# Patient Record
Sex: Female | Born: 1970 | Race: White | Hispanic: No | Smoking: Never smoker
Health system: Southern US, Community
[De-identification: ages and names within clinical notes are randomized; demographics above are authoritative.]

## PROBLEM LIST (undated history)

## (undated) DIAGNOSIS — Z9889 Other specified postprocedural states: Secondary | ICD-10-CM

## (undated) DIAGNOSIS — R112 Nausea with vomiting, unspecified: Secondary | ICD-10-CM

## (undated) DIAGNOSIS — M719 Bursopathy, unspecified: Secondary | ICD-10-CM

## (undated) DIAGNOSIS — M67919 Unspecified disorder of synovium and tendon, unspecified shoulder: Secondary | ICD-10-CM

## (undated) DIAGNOSIS — R61 Generalized hyperhidrosis: Secondary | ICD-10-CM

## (undated) DIAGNOSIS — J019 Acute sinusitis, unspecified: Secondary | ICD-10-CM

## (undated) DIAGNOSIS — E876 Hypokalemia: Secondary | ICD-10-CM

## (undated) DIAGNOSIS — I959 Hypotension, unspecified: Secondary | ICD-10-CM

## (undated) DIAGNOSIS — R51 Headache: Secondary | ICD-10-CM

## (undated) DIAGNOSIS — R5383 Other fatigue: Secondary | ICD-10-CM

## (undated) DIAGNOSIS — D649 Anemia, unspecified: Secondary | ICD-10-CM

## (undated) DIAGNOSIS — Z87898 Personal history of other specified conditions: Secondary | ICD-10-CM

## (undated) DIAGNOSIS — R631 Polydipsia: Secondary | ICD-10-CM

## (undated) DIAGNOSIS — L259 Unspecified contact dermatitis, unspecified cause: Secondary | ICD-10-CM

## (undated) DIAGNOSIS — R5381 Other malaise: Secondary | ICD-10-CM

## (undated) HISTORY — PX: COLONOSCOPY WITH ESOPHAGOGASTRODUODENOSCOPY (EGD): SHX5779

## (undated) HISTORY — DX: Hypotension, unspecified: I95.9

## (undated) HISTORY — DX: Other malaise: R53.81

## (undated) HISTORY — DX: Bursopathy, unspecified: M71.9

## (undated) HISTORY — PX: BREAST CYST ASPIRATION: SHX578

## (undated) HISTORY — DX: Unspecified disorder of synovium and tendon, unspecified shoulder: M67.919

## (undated) HISTORY — DX: Headache: R51

## (undated) HISTORY — PX: EXTERNAL AUDITORY CANAL RECONSTRUCTION: SHX428

## (undated) HISTORY — DX: Personal history of other specified conditions: Z87.898

## (undated) HISTORY — DX: Generalized hyperhidrosis: R61

## (undated) HISTORY — DX: Hypokalemia: E87.6

## (undated) HISTORY — DX: Unspecified contact dermatitis, unspecified cause: L25.9

## (undated) HISTORY — DX: Other fatigue: R53.83

## (undated) HISTORY — DX: Polydipsia: R63.1

## (undated) HISTORY — DX: Acute sinusitis, unspecified: J01.90

---

## 2000-11-19 ENCOUNTER — Other Ambulatory Visit: Admission: RE | Admit: 2000-11-19 | Discharge: 2000-11-19 | Payer: Self-pay | Admitting: Obstetrics and Gynecology

## 2000-11-20 ENCOUNTER — Encounter: Payer: Self-pay | Admitting: Emergency Medicine

## 2000-11-20 ENCOUNTER — Emergency Department (HOSPITAL_COMMUNITY): Admission: EM | Admit: 2000-11-20 | Discharge: 2000-11-20 | Payer: Self-pay | Admitting: Emergency Medicine

## 2001-07-14 ENCOUNTER — Other Ambulatory Visit: Admission: RE | Admit: 2001-07-14 | Discharge: 2001-07-14 | Payer: Self-pay | Admitting: Obstetrics and Gynecology

## 2001-07-29 ENCOUNTER — Ambulatory Visit (HOSPITAL_COMMUNITY): Admission: RE | Admit: 2001-07-29 | Discharge: 2001-07-29 | Payer: Self-pay | Admitting: Obstetrics and Gynecology

## 2001-07-29 ENCOUNTER — Encounter: Payer: Self-pay | Admitting: Obstetrics and Gynecology

## 2001-08-02 ENCOUNTER — Encounter: Admission: RE | Admit: 2001-08-02 | Discharge: 2001-08-02 | Payer: Self-pay | Admitting: Urology

## 2001-08-02 ENCOUNTER — Encounter: Payer: Self-pay | Admitting: Urology

## 2002-07-05 ENCOUNTER — Other Ambulatory Visit: Admission: RE | Admit: 2002-07-05 | Discharge: 2002-07-05 | Payer: Self-pay | Admitting: Obstetrics and Gynecology

## 2004-04-12 ENCOUNTER — Ambulatory Visit (HOSPITAL_COMMUNITY): Admission: RE | Admit: 2004-04-12 | Discharge: 2004-04-12 | Payer: Self-pay | Admitting: Obstetrics and Gynecology

## 2005-02-20 ENCOUNTER — Other Ambulatory Visit: Admission: RE | Admit: 2005-02-20 | Discharge: 2005-02-20 | Payer: Self-pay | Admitting: Obstetrics and Gynecology

## 2005-04-14 HISTORY — PX: TUBAL LIGATION: SHX77

## 2005-08-24 ENCOUNTER — Inpatient Hospital Stay (HOSPITAL_COMMUNITY): Admission: AD | Admit: 2005-08-24 | Discharge: 2005-08-24 | Payer: Self-pay | Admitting: Obstetrics and Gynecology

## 2005-09-11 ENCOUNTER — Inpatient Hospital Stay (HOSPITAL_COMMUNITY): Admission: AD | Admit: 2005-09-11 | Discharge: 2005-09-13 | Payer: Self-pay | Admitting: Obstetrics and Gynecology

## 2005-09-11 ENCOUNTER — Encounter (INDEPENDENT_AMBULATORY_CARE_PROVIDER_SITE_OTHER): Payer: Self-pay | Admitting: Specialist

## 2005-09-12 ENCOUNTER — Encounter (INDEPENDENT_AMBULATORY_CARE_PROVIDER_SITE_OTHER): Payer: Self-pay | Admitting: Specialist

## 2006-04-14 LAB — CONVERTED CEMR LAB: Pap Smear: NORMAL

## 2006-09-29 ENCOUNTER — Other Ambulatory Visit: Admission: RE | Admit: 2006-09-29 | Discharge: 2006-09-29 | Payer: Self-pay | Admitting: General Surgery

## 2007-06-29 ENCOUNTER — Encounter: Payer: Self-pay | Admitting: Family Medicine

## 2008-11-09 ENCOUNTER — Ambulatory Visit: Payer: Self-pay | Admitting: Family Medicine

## 2008-11-09 DIAGNOSIS — M719 Bursopathy, unspecified: Secondary | ICD-10-CM

## 2008-11-09 DIAGNOSIS — M67919 Unspecified disorder of synovium and tendon, unspecified shoulder: Secondary | ICD-10-CM | POA: Insufficient documentation

## 2008-11-09 HISTORY — DX: Unspecified disorder of synovium and tendon, unspecified shoulder: M67.919

## 2008-11-24 DIAGNOSIS — Z87898 Personal history of other specified conditions: Secondary | ICD-10-CM

## 2008-11-24 DIAGNOSIS — L259 Unspecified contact dermatitis, unspecified cause: Secondary | ICD-10-CM | POA: Insufficient documentation

## 2008-11-24 HISTORY — DX: Personal history of other specified conditions: Z87.898

## 2008-11-24 HISTORY — DX: Unspecified contact dermatitis, unspecified cause: L25.9

## 2009-03-07 ENCOUNTER — Ambulatory Visit: Payer: Self-pay | Admitting: Family Medicine

## 2009-03-07 DIAGNOSIS — J019 Acute sinusitis, unspecified: Secondary | ICD-10-CM | POA: Insufficient documentation

## 2009-03-07 HISTORY — DX: Acute sinusitis, unspecified: J01.90

## 2009-05-04 ENCOUNTER — Ambulatory Visit: Payer: Self-pay | Admitting: Family Medicine

## 2009-05-04 DIAGNOSIS — R631 Polydipsia: Secondary | ICD-10-CM | POA: Insufficient documentation

## 2009-05-04 DIAGNOSIS — R61 Generalized hyperhidrosis: Secondary | ICD-10-CM | POA: Insufficient documentation

## 2009-05-04 DIAGNOSIS — R5383 Other fatigue: Secondary | ICD-10-CM

## 2009-05-04 DIAGNOSIS — R5381 Other malaise: Secondary | ICD-10-CM

## 2009-05-04 HISTORY — DX: Generalized hyperhidrosis: R61

## 2009-05-04 HISTORY — DX: Other malaise: R53.81

## 2009-05-04 HISTORY — DX: Polydipsia: R63.1

## 2009-05-04 HISTORY — DX: Other fatigue: R53.83

## 2009-05-04 LAB — CONVERTED CEMR LAB: Blood Glucose, Fingerstick: 76

## 2009-05-07 ENCOUNTER — Telehealth: Payer: Self-pay | Admitting: Family Medicine

## 2009-05-07 LAB — CONVERTED CEMR LAB
ALT: 15 units/L (ref 0–35)
AST: 20 units/L (ref 0–37)
Albumin: 3.9 g/dL (ref 3.5–5.2)
Alkaline Phosphatase: 68 units/L (ref 39–117)
BUN: 13 mg/dL (ref 6–23)
Basophils Absolute: 0 10*3/uL (ref 0.0–0.1)
Basophils Relative: 1 % (ref 0.0–3.0)
Bilirubin, Direct: 0 mg/dL (ref 0.0–0.3)
CO2: 27 meq/L (ref 19–32)
Calcium: 8.7 mg/dL (ref 8.4–10.5)
Chloride: 108 meq/L (ref 96–112)
Creatinine, Ser: 0.7 mg/dL (ref 0.4–1.2)
Eosinophils Absolute: 0 10*3/uL (ref 0.0–0.7)
Eosinophils Relative: 0.4 % (ref 0.0–5.0)
GFR calc non Af Amer: 99.82 mL/min (ref >60)
Glucose, Bld: 86 mg/dL (ref 70–99)
HCT: 33.7 % — ABNORMAL LOW (ref 36.0–46.0)
Hemoglobin: 11 g/dL — ABNORMAL LOW (ref 12.0–15.0)
Lymphocytes Relative: 36.9 % (ref 12.0–46.0)
Lymphs Abs: 1.8 10*3/uL (ref 0.7–4.0)
MCHC: 32.5 g/dL (ref 30.0–36.0)
MCV: 90.8 fL (ref 78.0–100.0)
Monocytes Absolute: 0.4 10*3/uL (ref 0.1–1.0)
Monocytes Relative: 8.8 % (ref 3.0–12.0)
Neutro Abs: 2.8 10*3/uL (ref 1.4–7.7)
Neutrophils Relative %: 52.9 % (ref 43.0–77.0)
Platelets: 227 10*3/uL (ref 150.0–400.0)
Potassium: 3.5 meq/L (ref 3.5–5.1)
RBC: 3.72 M/uL — ABNORMAL LOW (ref 3.87–5.11)
RDW: 14.8 % — ABNORMAL HIGH (ref 11.5–14.6)
Sodium: 140 meq/L (ref 135–145)
TSH: 1.05 microintl units/mL (ref 0.35–5.50)
Total Bilirubin: 0.8 mg/dL (ref 0.3–1.2)
Total Protein: 7.2 g/dL (ref 6.0–8.3)
WBC: 5 10*3/uL (ref 4.5–10.5)

## 2009-10-15 ENCOUNTER — Emergency Department (HOSPITAL_BASED_OUTPATIENT_CLINIC_OR_DEPARTMENT_OTHER): Admission: EM | Admit: 2009-10-15 | Discharge: 2009-10-15 | Payer: Self-pay | Admitting: Emergency Medicine

## 2009-10-17 ENCOUNTER — Ambulatory Visit: Payer: Self-pay | Admitting: Family Medicine

## 2009-10-17 DIAGNOSIS — I959 Hypotension, unspecified: Secondary | ICD-10-CM | POA: Insufficient documentation

## 2009-10-17 DIAGNOSIS — E876 Hypokalemia: Secondary | ICD-10-CM | POA: Insufficient documentation

## 2009-10-17 HISTORY — DX: Hypotension, unspecified: I95.9

## 2009-10-17 HISTORY — DX: Hypokalemia: E87.6

## 2009-10-17 LAB — CONVERTED CEMR LAB
BUN: 12 mg/dL (ref 6–23)
CO2: 27 meq/L (ref 19–32)
Calcium: 8.6 mg/dL (ref 8.4–10.5)
Chloride: 108 meq/L (ref 96–112)
Creatinine, Ser: 0.7 mg/dL (ref 0.4–1.2)
GFR calc non Af Amer: 99.57 mL/min (ref >60)
Glucose, Bld: 93 mg/dL (ref 70–99)
Potassium: 4 meq/L (ref 3.5–5.1)
Sodium: 143 meq/L (ref 135–145)

## 2009-10-23 ENCOUNTER — Ambulatory Visit: Payer: Self-pay | Admitting: Radiology

## 2009-10-23 ENCOUNTER — Emergency Department (HOSPITAL_BASED_OUTPATIENT_CLINIC_OR_DEPARTMENT_OTHER): Admission: EM | Admit: 2009-10-23 | Discharge: 2009-10-23 | Payer: Self-pay | Admitting: Emergency Medicine

## 2010-03-14 ENCOUNTER — Ambulatory Visit: Payer: Self-pay | Admitting: Internal Medicine

## 2010-03-14 LAB — CONVERTED CEMR LAB
BUN: 10 mg/dL (ref 6–23)
Basophils Absolute: 0 10*3/uL (ref 0.0–0.1)
Basophils Relative: 0.2 % (ref 0.0–3.0)
CO2: 28 meq/L (ref 19–32)
Calcium: 8.8 mg/dL (ref 8.4–10.5)
Chloride: 104 meq/L (ref 96–112)
Creatinine, Ser: 0.7 mg/dL (ref 0.4–1.2)
Eosinophils Absolute: 0 10*3/uL (ref 0.0–0.7)
Eosinophils Relative: 0.3 % (ref 0.0–5.0)
GFR calc non Af Amer: 99.36 mL/min (ref >60)
Glucose, Bld: 90 mg/dL (ref 70–99)
HCT: 35.7 % — ABNORMAL LOW (ref 36.0–46.0)
Hemoglobin: 11.9 g/dL — ABNORMAL LOW (ref 12.0–15.0)
Lymphocytes Relative: 15.9 % (ref 12.0–46.0)
Lymphs Abs: 1.4 10*3/uL (ref 0.7–4.0)
MCHC: 33.3 g/dL (ref 30.0–36.0)
MCV: 89.1 fL (ref 78.0–100.0)
Monocytes Absolute: 0.6 10*3/uL (ref 0.1–1.0)
Monocytes Relative: 7.3 % (ref 3.0–12.0)
Neutro Abs: 6.5 10*3/uL (ref 1.4–7.7)
Neutrophils Relative %: 76.3 % (ref 43.0–77.0)
Platelets: 275 10*3/uL (ref 150.0–400.0)
Potassium: 4 meq/L (ref 3.5–5.1)
RBC: 4.01 M/uL (ref 3.87–5.11)
RDW: 16.3 % — ABNORMAL HIGH (ref 11.5–14.6)
Sodium: 139 meq/L (ref 135–145)
WBC: 8.5 10*3/uL (ref 4.5–10.5)

## 2010-03-15 ENCOUNTER — Telehealth: Payer: Self-pay | Admitting: Internal Medicine

## 2010-03-15 ENCOUNTER — Telehealth: Payer: Self-pay

## 2010-03-15 ENCOUNTER — Encounter: Payer: Self-pay | Admitting: Internal Medicine

## 2010-05-14 NOTE — Progress Notes (Signed)
Summary: Pt req lab results/cjr  Phone Note Call from Patient Call back at [phone] cell   Caller: Patient Summary of Call: Pt called and is req lab results.  Initial call taken by: Lucy Antigua,  May 07, 2009 10:41 AM  Follow-up for Phone Call        Pt called, labs given, instructed to begin taling milti-vit with iron and return for CBC in 1 month Follow-up by: Sid Falcon LPN,  May 07, 2009 12:05 PM

## 2010-05-14 NOTE — Letter (Signed)
Summary: Out of Work  Adult nurse at [employer]  [address]   [place], [place] [address]   Phone: [phone]  Fax: [phone]    March 15, 2010   Employee:  MICHAELENE DUTAN    To Whom It May Concern:   For Medical reasons, please excuse the above named employee from work for the following dates:  Start:   03-14-10  End:   03-18-10  If you need additional information, please feel free to contact our office.         Sincerely,    Gordy Savers  MD

## 2010-05-14 NOTE — Assessment & Plan Note (Signed)
Summary: DIABETIC CONCERNS / NIGHT SWEATS // RS   Vital Signs:  Patient profile:   40 year old female Weight:      107 pounds Temp:     98.2 degrees F oral BP sitting:   110 / 70  (left arm) Cuff size:   regular  Vitals Entered By: Sid Falcon LPN (May 04, 2009 2:19 PM) CC: diabetic concerns CBG Result 76   History of Present Illness: Patient presents today with concerns that she has not felt well for a couple of months. Has had fatigue and complaining mostly of night sweats. No fever. Has noticed increased appetite and intake but no weight changes. Intermittent loose stools. Is also noticing urine frequency and increased thirst. No burning with urination. Increased irritability. Denies any consistent depressed mood. Denies any abdominal pain, headaches, cough, dyspnea, or chest pain. Takes no medications regularly.  Menses are regular.  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    Physical Exam  General:  Well-developed,well-nourished,in no acute distress;  alert,appropriate and cooperative throughout examination Eyes:  No corneal or conjunctival inflammation noted. EOMI. Perrla. Funduscopic exam benign, without hemorrhages, exudates or papilledema. Vision grossly normal. Ears:  External ear exam shows no significant lesions or deformities.  Otoscopic examination reveals clear canals, tympanic membranes are intact bilaterally without bulging, retraction, inflammation or discharge. Hearing is grossly normal bilaterally. Nose:  External nasal examination shows no deformity or inflammation. Nasal mucosa are pink and moist without lesions or exudates. Mouth:  Oral mucosa and oropharynx without lesions or exudates.  Teeth in good repair. Neck:  No deformities, masses, or tenderness noted. Lungs:  Normal respiratory effort, chest expands symmetrically. Lungs are clear to auscultation, no crackles or wheezes. Heart:  Normal rate and regular rhythm. S1 and S2 normal without gallop, murmur, click, rub or other extra sounds. Abdomen:  Bowel sounds positive,abdomen soft and non-tender without masses, organomegaly or hernias noted. Extremities:  No clubbing, cyanosis, edema, or deformity noted with normal full range of motion of all joints.   Neurologic:  alert & oriented X3, cranial nerves II-XII intact, and strength normal in all extremities.   Skin:  Intact without suspicious lesions or rashes Cervical Nodes:  No lymphadenopathy noted Axillary Nodes:  No palpable lymphadenopathy   Impression & Recommendations:  Problem # 1:  NIGHT SWEATS (ICD-780.8) Assessment New ?etiology.  R/O hyperthyroidism.  Doubt infectious. Orders: TLB-TSH (Thyroid Stimulating Hormone) (84443-TSH) TLB-CBC Platelet - w/Differential (85025-CBCD) TLB-Hepatic/Liver Function Pnl (80076-HEPATIC) TLB-BMP (Basic Metabolic Panel-BMET) (80048-METABOL)  Problem # 2:  POLYDIPSIA (ICD-783.5) Assessment: New Nonfasting CBG in office is 76. Orders: TLB-BMP (Basic Metabolic Panel-BMET)  (80048-METABOL)  Problem # 3:  FATIGUE (ICD-780.79)  Complete Medication List: 1)  Hydrocodone-acetaminophen 5-500 Mg Tabs (Hydrocodone-acetaminophen) .... One to two by mouth q 4-6 hours as needed  Other Orders: Capillary Blood Glucose/CBG (78295)  Patient Instructions: 1)  Try reduce sugar intake. 2)  We will call you by Monday regarding lab work.

## 2010-05-14 NOTE — Assessment & Plan Note (Signed)
Summary: Post Urgent Care visit./dm   Vital Signs:  Patient profile:   40 year old female Weight:      113 pounds Temp:     98. degrees F oral BP sitting:   80 / 60  (left arm) BP standing:   75 / 50 Cuff size:   regular  Vitals Entered By: Kathrynn Speed CMA (October 17, 2009 10:04 AM)  Serial Vital Signs/Assessments:  Time      Position  BP       Pulse  Resp  Temp     By           Standing  75/50                          Evelena Peat MD  CC: Post Urgent care visit/ UTI / low potassium/ low BP/ low Iron Amemia/ white & red blood count abnormal /src   History of Present Illness: Patient is seen following emergency room visit July 4. She's been under a lot of stress and noticed increased dizziness on that day. She went to a local fire department had blood pressure of 75/40. CBG was 220 but she just had a Coke.  Patient went to [facility] emergency room and received IV fluids. Diagnosed with UTI treated with Macrodantin. Reportedly had low potassium and slightly low hemoglobin though we do not have values at this time. She was given oral potassium replacement. Patient improved somewhat symptomatically after IV fluids.  She still has fatigue at this point and some mild dizziness but less orthostasis. Taking no medications. Does have fairly heavy menses. Currently not using iron. Denies a recent fever. No diarrhea, nausea, or vomiting. No localized pain.  Current Medications (verified): 1)  Nitrofurantoin Macrocrystal 100 Mg Caps (Nitrofurantoin Macrocrystal) .... 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    Physical Exam  General:  Well-developed,well-nourished,in no acute distress; alert,appropriate and cooperative throughout examination Mouth:  Oral mucosa and oropharynx without lesions or exudates.  Teeth in good repair. Neck:  No deformities, masses, or tenderness noted. Lungs:  Normal respiratory effort, chest expands symmetrically. Lungs are clear to auscultation, no crackles or wheezes. Heart:  Normal rate and regular rhythm. S1 and S2 normal without gallop, murmur, click, rub or other extra sounds. Extremities:  no edema. Good capillary refill throughout Neurologic:  alert & oriented X3, cranial nerves II-XII intact, strength normal in all extremities, and gait normal.   Psych:  normally interactive, good eye contact, not depressed appearing, and slightly anxious.     Impression & Recommendations:  Problem # 1:  FATIGUE (ICD-780.79) Prob multifactorial-stress, anemia, low BP.  Problem # 2:  LOW BLOOD PRESSURE (ICD-458.9) low blood pressure but no signif orthostatic change.  Has low  BP at baseline and mild anemia and recent dehydration probably contibuted.  She will consider Gatorade 16 oz daily and reduce caffeine.  Start Fe supplement and consider repeat hgb in one month.  Problem # 3:  HYPOKALEMIA (ICD-276.8) by hx,  repeat today. Orders: Venipuncture (16109) TLB-BMP (Basic Metabolic Panel-BMET) (80048-METABOL)  Complete Medication List: 1)  Nitrofurantoin Macrocrystal 100 Mg Caps  (Nitrofurantoin macrocrystal) .... One capsule by mouth twice a day  Patient Instructions: 1)  start iron sulfate 325 mg one twice daily. 2)  consider supplementing with Gatorade 16 ounces per day 3)  Please schedule a follow-up appointment in 1 month.

## 2010-05-14 NOTE — Assessment & Plan Note (Signed)
Summary: body pains//ccm   Vital Signs:  Patient profile:   40 year old female Weight:      116 pounds Temp:     98.0 degrees F oral BP sitting:   90 / 70  (right arm) Cuff size:   regular  Vitals Entered By: Duard Brady LPN (March 14, 2010 3:37 PM) CC: c/o lightheaded , dizziness, ears plugged , weakness Is Patient Diabetic? No   CC:  c/o lightheaded , dizziness, ears plugged , and weakness.  History of Present Illness: 40 year old patient who has a history of chronic right shoulder pain.  She states this is aggravated by deep inhalation and exhalation.  She states she has had this pain for years.  She has benefited at least temporarily from cortisone injections.  She is not had one of these in some time.  She also complains of a general sense of unwellness.  She states she has had a prior history of anemia, as well as hypokalemia.  She is on no chronic medications.  He does have a history of chronic fatigue and hypotension.  Preventive Screening-Counseling & Management  Alcohol-Tobacco     Smoking Status: never  Allergies (verified): No Known Drug Allergies  Past History:  Past Medical History: Reviewed history from 11/09/2008 and no changes required. frequent headaches  Family History: Reviewed history from 11/09/2008 and no changes required. Family History of Arthritis Family History High cholesterol, parent, grandparent Family History Hypertension, parent, grandparent Family History Lung cancer Family history diabetes, grandparent  Social History: Reviewed history from 11/09/2008 and no changes required. 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  The patient denies anorexia, weight loss, weight gain, vision loss, decreased hearing, hoarseness, syncope, dyspnea on exertion, peripheral edema, prolonged cough, headaches, hemoptysis, abdominal pain,  melena, hematochezia, severe indigestion/heartburn, hematuria, incontinence, genital sores, suspicious skin lesions, transient blindness, difficulty walking, depression, unusual weight change, abnormal bleeding, enlarged lymph nodes, angioedema, and breast masses.    Physical Exam  General:  Well-developed,well-nourished,in no acute distress; alert,appropriate and cooperative throughout examination Head:  Normocephalic and atraumatic without obvious abnormalities. No apparent alopecia or balding. Eyes:  No corneal or conjunctival inflammation noted. EOMI. Perrla. Funduscopic exam benign, without hemorrhages, exudates or papilledema. Vision grossly normal. Ears:  External ear exam shows no significant lesions or deformities.  Otoscopic examination reveals clear canals, tympanic membranes are intact bilaterally without bulging, retraction, inflammation or discharge. Hearing is grossly normal bilaterally. Nose:  External nasal examination shows no deformity or inflammation. Nasal mucosa are pink and moist without lesions or exudates. Mouth:  Oral mucosa and oropharynx without lesions or exudates.  Teeth in good repair. Neck:  No deformities, masses, or tenderness noted. Lungs:  Normal respiratory effort, chest expands symmetrically. Lungs are clear to auscultation, no crackles or wheezes. Heart:  Normal rate and regular rhythm. S1 and S2 normal without gallop, murmur, click, rub or other extra sounds. Abdomen:  Bowel sounds positive,abdomen soft and non-tender without masses, organomegaly or hernias noted. Msk:  no tenderness about the right shoulder   Impression & Recommendations:  Problem # 1:  HYPOKALEMIA (ICD-276.8)  Orders: TLB-BMP (Basic Metabolic Panel-BMET) (80048-METABOL) Specimen Handling (52841)  Problem # 2:  FATIGUE (ICD-780.79)  Orders: Venipuncture (32440) TLB-CBC Platelet - w/Differential (85025-CBCD) Specimen Handling (10272)  Problem # 3:  UNSPEC DISORDERS  [place] (ICD-726.10)  Complete Medication List: 1)  Hydrocodone-acetaminophen 5-500 Mg Tabs (Hydrocodone-acetaminophen) .... One every 6 hours as needed for pain  Patient Instructions: 1)  Please schedule a follow-up appointment as needed. 2)  Take 400-600mg  of Ibuprofen (Advil, Motrin) with food every 4-6 hours as needed for relief of pain or comfort of fever. Prescriptions: HYDROCODONE-ACETAMINOPHEN 5-500 MG TABS (HYDROCODONE-ACETAMINOPHEN) one every 6 hours as needed for pain  #50 x 0   Entered and Authorized by:   Gordy Savers  MD   Signed by:   Gordy Savers  MD on 03/14/2010   Method used:   Print then Give to Patient   RxID:   [id number]    Orders Added: 1)  Est. Patient Level IV [14782] 2)  Venipuncture [95621] 3)  TLB-CBC Platelet - w/Differential [85025-CBCD] 4)  TLB-BMP (Basic Metabolic Panel-BMET) [80048-METABOL] 5)  Specimen Handling [99000]  Appended Document: body pains//ccm     Allergies: No Known Drug Allergies   Complete Medication List: 1)  Hydrocodone-acetaminophen 5-500 Mg Tabs (Hydrocodone-acetaminophen) .... One every 6 hours as needed for pain  Other Orders: Depo- Medrol 80mg  (J1040) Admin of Therapeutic Inj  intramuscular or subcutaneous (30865)   Medication Administration  Injection # 1:    Medication: Depo- Medrol 80mg     Diagnosis: UNSPEC DISORDERS [place] (ICD-726.10)    Route: IM    Site: R deltoid    Exp Date: 10/2012    Lot #: Dalbert [id number]    Mfr: Pharmacia    Patient tolerated injection without complications    Given by: Duard Brady LPN (March 14, 2010 5:12 PM)  Orders Added: 1)  Depo- Medrol 80mg  [J1040] 2)  Admin of Therapeutic Inj  intramuscular or subcutaneous [78469]

## 2010-05-14 NOTE — Progress Notes (Signed)
Summary: Pt requesting call from Dr Caryl Never  Phone Note Call from Patient Call back at Work Phone [phone]   Caller: Patient Call For: Evelena Peat MD Summary of Call: Pt requesting a personal call back from Dr Caryl Never about something confidential that was discussed at last OV Pt had alot of questions about her lab results, ofered to mail labs to pt home, this was done. Initial call taken by: Sid Falcon LPN,  May 07, 2009 4:58 PM  Follow-up for Phone Call        Called 05-07-09 around 5:05pm and left message to call back. Follow-up by: Evelena Peat MD,  May 08, 2009 8:27 AM  Additional Follow-up for Phone Call Additional follow up Details #1::        Pt is calling at 4:30 asking if Dr. Caryl Never can call her back Wednesday am. [phone] Additional Follow-up by: Lynann Beaver CMA,  May 08, 2009 4:32 PM    Additional Follow-up for Phone Call Additional follow up Details #2::    called and left message again for pt to call. Follow-up by: Evelena Peat MD,  May 09, 2009 12:18 PM  Additional Follow-up for Phone Call Additional follow up Details #3:: Details for Additional Follow-up Action Taken: Pt returned call, requesting call after 5pm tonight  [phone] Sid Falcon LPN  May 09, 2009 3:38 PM  Spoke with pt.  Marital stress and daily anxiety and depression issues.  Still has some night sweats.  Has had marital counseling.  After long discussion we have agreed to trial of sertraline 50 mg once daily and pt to f/u one month to reassess. Additional Follow-up by: Evelena Peat MD,  May 10, 2009 1:23 PM  New/Updated Medications: SERTRALINE HCL 50 MG TABS (SERTRALINE HCL) one by mouth once daily Prescriptions: SERTRALINE HCL 50 MG TABS (SERTRALINE HCL) one by mouth once daily  #30 x 3   Entered and Authorized by:   Evelena Peat MD   Signed by:   Evelena Peat MD on 05/10/2009   Method used:   Electronically to        [facility]  [place] [address] (retail)       [address]       [place], [place]  [address]       Ph: [phone] or [phone]       Fax: [phone]   RxID:   [id number]

## 2010-05-14 NOTE — Letter (Signed)
Summary: Records from [facility] 2008 - 2009  Records from [facility] 2008 - 2009   Imported By: Maryln Gottron 12/07/2008 07:55:05  _____________________________________________________________________  External Attachment:    Type:   Image     Comment:   External Document

## 2010-05-14 NOTE — Progress Notes (Signed)
Summary: Work note request, pt is vomiting & diarrhea  Phone Note Call from Patient Call back at [facility] Phone [phone]   Caller: Patient Call For: Amador Cunas Summary of Call: Pt called again, she is experiencing nausea, vomiting and diarrhea.  Questioning if it could be the pain med or cort inj for OV yesterday.  Pt took one pain med last night, another this am.  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  Insuructed pt to stop taking the Hydrocodone and use OTC tylenol or Aleve, bland diet and work on staying hydrated.  Pt denies fever or SOB.    Husband Terrilynn Postell will pick up work note Initial call taken by: Sid Falcon LPN,  March 15, 2010 10:59 AM  Follow-up for Phone Call        Note for work done.    Follow-up by: Duard Brady LPN,  March 15, 2010 11:04 AM  Additional Follow-up for Phone Call Additional follow up Details #1::        Does Dr Amador Cunas want her to try any other pain med?   Sid Falcon LPN  March 15, 2010 1:40 PM     Additional Follow-up for Phone Call Additional follow up Details #2::    try tylenol and alleve; give depomedrol more time Follow-up by: Gordy Savers  MD,  March 15, 2010 4:23 PM  Additional Follow-up for Phone Call Additional follow up Details #3:: Details for Additional Follow-up Action Taken: Pt informed Additional Follow-up by: Sid Falcon LPN,  March 15, 2010 4:44 PM  ok

## 2010-05-14 NOTE — Progress Notes (Signed)
Summary: needs note  Phone Note Call from Patient Call back at Home Phone [phone]   Caller: Patient---live call Summary of Call: Saw DR K yesterday. Need a doctor's note from yesterday. She states that she is in a lot pain and is in bed this mornig. call her when ready for pick up. Initial call taken by: Warnell Forester,  March 15, 2010 9:15 AM  Follow-up for Phone Call        attempt to cqall - ans mach - LMTCB if questions - note for work ready for pick up KIK Follow-up by: Duard Brady LPN,  March 15, 2010 10:01 AM

## 2010-05-14 NOTE — Assessment & Plan Note (Signed)
Summary: SINUSES//CCM   Vital Signs:  Patient profile:   40 year old female Temp:     98.8 degrees F oral BP sitting:   108 / 60  (left arm) Cuff size:   regular  Vitals Entered By: Sid Falcon LPN (March 07, 2009 3:01 PM) CC: Head cold X 10 days, ears plugged, cough   History of Present Illness: Acute visit. 10 day history sinus congestion, facial pain, bilateral ear pressure and now cough productive of thick green sputum. History of multiple ear infections in childhood. No recent fever. Taking plenty of fluids. No known drug allergies.  Allergies (verified): No Known Drug Allergies  Past History:  Past Medical History: Last updated: 11/09/2008 frequent headaches  Past Surgical History: Last updated: 11/24/2008 Tubal ligation 2007 Dr Delbert Phenix Tubes as a child Reconstuctive Ear Surgery  Review of Systems      See HPI  Physical Exam  General:  Well-developed,well-nourished,in no acute distress; alert,appropriate and cooperative throughout examination Ears:  intensive scarring of both eardrums. Otherwise no acute changes Nose:  erythema of nasal mucosa. No purulent drainage Mouth:  Oral mucosa and oropharynx without lesions or exudates.  Teeth in good repair. Neck:  No deformities, masses, or tenderness noted. Lungs:  Normal respiratory effort, chest expands symmetrically. Lungs are clear to auscultation, no crackles or wheezes. Heart:  Normal rate and regular rhythm. S1 and S2 normal without gallop, murmur, click, rub or other extra sounds.   Impression & Recommendations:  Problem # 1:  SINUSITIS, ACUTE (ICD-461.9)  Her updated medication list for this problem includes:    Azithromycin 250 Mg Tabs (Azithromycin) .Marland Kitchen... 2 by mouth today then 1 by mouth once daily for 4 days.  Complete Medication List: 1)  Hydrocodone-acetaminophen 5-500 Mg Tabs (Hydrocodone-acetaminophen) .... One to two by mouth q 4-6 hours as needed 2)  Azithromycin 250 Mg Tabs (Azithromycin)  .... 2 by mouth today then 1 by mouth once daily for 4 days.  Patient Instructions: 1)  Consider over-the-counter Mucinex 2 tablets twice daily and drinking of fluids. Prescriptions: AZITHROMYCIN 250 MG TABS (AZITHROMYCIN) 2 by mouth today then 1 by mouth once daily for 4 days.  #6 x 0   Entered and Authorized by:   Evelena Peat MD   Signed by:   Evelena Peat MD on 03/07/2009   Method used:   [facility]        [facility]  [place] [address]       [address]       [place], [place]  [address]       Ph: [phone] or [phone]       Fax: [phone]   RxID:   [id number]

## 2010-05-14 NOTE — Progress Notes (Signed)
Summary: Lab results request  Phone Note Call from Patient   Caller: Patient Call For: [facility] Summary of Call: VM, pt requesting lab results Initial call taken by: Sid Falcon LPN,  March 15, 2010 4:46 PM  Follow-up for Phone Call        all nl Follow-up by: Gordy Savers  MD,  March 15, 2010 4:57 PM  Additional Follow-up for Phone Call Additional follow up Details #1::        Pt informed on VM Additional Follow-up by: Sid Falcon LPN,  March 15, 2010 5:10 PM

## 2010-06-30 LAB — CSF CELL COUNT WITH DIFFERENTIAL
RBC Count, CSF: 0 /mm3
Tube #: 4
WBC, CSF: 0 /mm3 (ref 0–5)

## 2010-06-30 LAB — URINE MICROSCOPIC-ADD ON

## 2010-06-30 LAB — CBC
HCT: 31.7 % — ABNORMAL LOW (ref 36.0–46.0)
HCT: 33 % — ABNORMAL LOW (ref 36.0–46.0)
Hemoglobin: 10.4 g/dL — ABNORMAL LOW (ref 12.0–15.0)
Hemoglobin: 10.5 g/dL — ABNORMAL LOW (ref 12.0–15.0)
MCH: 28 pg (ref 26.0–34.0)
MCH: 29.3 pg (ref 26.0–34.0)
MCHC: 31.7 g/dL (ref 30.0–36.0)
MCHC: 33 g/dL (ref 30.0–36.0)
MCV: 88.5 fL (ref 78.0–100.0)
MCV: 88.9 fL (ref 78.0–100.0)
Platelets: 233 10*3/uL (ref 150–400)
Platelets: 259 10*3/uL (ref 150–400)
RBC: 3.56 MIL/uL — ABNORMAL LOW (ref 3.87–5.11)
RBC: 3.73 MIL/uL — ABNORMAL LOW (ref 3.87–5.11)
RDW: 15.4 % (ref 11.5–15.5)
RDW: 15.4 % (ref 11.5–15.5)
WBC: 3.7 10*3/uL — ABNORMAL LOW (ref 4.0–10.5)
WBC: 6.8 10*3/uL (ref 4.0–10.5)

## 2010-06-30 LAB — URINALYSIS, ROUTINE W REFLEX MICROSCOPIC
Bilirubin Urine: NEGATIVE
Bilirubin Urine: NEGATIVE
Glucose, UA: 100 mg/dL — AB
Glucose, UA: NEGATIVE mg/dL
Hgb urine dipstick: NEGATIVE
Ketones, ur: 15 mg/dL — AB
Ketones, ur: NEGATIVE mg/dL
Nitrite: NEGATIVE
Nitrite: POSITIVE — AB
Protein, ur: NEGATIVE mg/dL
Protein, ur: NEGATIVE mg/dL
Specific Gravity, Urine: 1.007 (ref 1.005–1.030)
Specific Gravity, Urine: 1.029 (ref 1.005–1.030)
Urobilinogen, UA: 0.2 mg/dL (ref 0.0–1.0)
Urobilinogen, UA: 0.2 mg/dL (ref 0.0–1.0)
pH: 6 (ref 5.0–8.0)
pH: 7 (ref 5.0–8.0)

## 2010-06-30 LAB — COMPREHENSIVE METABOLIC PANEL
ALT: 10 U/L (ref 0–35)
ALT: 11 U/L (ref 0–35)
AST: 16 U/L (ref 0–37)
AST: 18 U/L (ref 0–37)
Albumin: 3.6 g/dL (ref 3.5–5.2)
Albumin: 3.8 g/dL (ref 3.5–5.2)
Alkaline Phosphatase: 79 U/L (ref 39–117)
Alkaline Phosphatase: 85 U/L (ref 39–117)
BUN: 12 mg/dL (ref 6–23)
BUN: 12 mg/dL (ref 6–23)
CO2: 27 mEq/L (ref 19–32)
CO2: 28 mEq/L (ref 19–32)
Calcium: 8.5 mg/dL (ref 8.4–10.5)
Calcium: 8.6 mg/dL (ref 8.4–10.5)
Chloride: 106 mEq/L (ref 96–112)
Chloride: 108 mEq/L (ref 96–112)
Creatinine, Ser: 0.7 mg/dL (ref 0.4–1.2)
Creatinine, Ser: 0.7 mg/dL (ref 0.4–1.2)
GFR calc Af Amer: >60 mL/min (ref >60)
GFR calc Af Amer: >60 mL/min (ref >60)
GFR calc non Af Amer: >60 mL/min (ref >60)
GFR calc non Af Amer: >60 mL/min (ref >60)
Glucose, Bld: 83 mg/dL (ref 70–99)
Glucose, Bld: 92 mg/dL (ref 70–99)
Potassium: 3.4 mEq/L — ABNORMAL LOW (ref 3.5–5.1)
Potassium: 4.1 mEq/L (ref 3.5–5.1)
Sodium: 144 mEq/L (ref 135–145)
Sodium: 145 mEq/L (ref 135–145)
Total Bilirubin: 0.5 mg/dL (ref 0.3–1.2)
Total Bilirubin: 0.6 mg/dL (ref 0.3–1.2)
Total Protein: 7.1 g/dL (ref 6.0–8.3)
Total Protein: 7.5 g/dL (ref 6.0–8.3)

## 2010-06-30 LAB — DIFFERENTIAL
Basophils Absolute: 0 10*3/uL (ref 0.0–0.1)
Basophils Absolute: 0.2 10*3/uL — ABNORMAL HIGH (ref 0.0–0.1)
Basophils Relative: 0 % (ref 0–1)
Basophils Relative: 2 % — ABNORMAL HIGH (ref 0–1)
Eosinophils Absolute: 0 10*3/uL (ref 0.0–0.7)
Eosinophils Absolute: 0 10*3/uL (ref 0.0–0.7)
Eosinophils Relative: 0 % (ref 0–5)
Eosinophils Relative: 1 % (ref 0–5)
Lymphocytes Relative: 12 % (ref 12–46)
Lymphocytes Relative: 46 % (ref 12–46)
Lymphs Abs: 0.4 10*3/uL — ABNORMAL LOW (ref 0.7–4.0)
Lymphs Abs: 3.1 10*3/uL (ref 0.7–4.0)
Monocytes Absolute: 0.4 10*3/uL (ref 0.1–1.0)
Monocytes Absolute: 0.6 10*3/uL (ref 0.1–1.0)
Monocytes Relative: 15 % — ABNORMAL HIGH (ref 3–12)
Monocytes Relative: 6 % (ref 3–12)
Neutro Abs: 2.7 10*3/uL (ref 1.7–7.7)
Neutro Abs: 3.1 10*3/uL (ref 1.7–7.7)
Neutrophils Relative %: 46 % (ref 43–77)
Neutrophils Relative %: 72 % (ref 43–77)

## 2010-06-30 LAB — PREGNANCY, URINE
Preg Test, Ur: NEGATIVE
Preg Test, Ur: NEGATIVE

## 2010-06-30 LAB — PROTEIN, CSF: Total  Protein, CSF: 23 mg/dL (ref 15–45)

## 2010-06-30 LAB — CSF CULTURE: Culture: NO GROWTH

## 2010-06-30 LAB — CSF CULTURE W GRAM STAIN: Gram Stain: NONE SEEN

## 2010-06-30 LAB — GLUCOSE, CSF: Glucose, CSF: 58 mg/dL (ref 43–76)

## 2010-08-16 ENCOUNTER — Emergency Department (HOSPITAL_COMMUNITY): Payer: 59

## 2010-08-16 ENCOUNTER — Emergency Department (HOSPITAL_COMMUNITY)
Admission: EM | Admit: 2010-08-16 | Discharge: 2010-08-16 | Disposition: A | Discharge status: Home / Self Care | Payer: 59 | Attending: Emergency Medicine | Admitting: Emergency Medicine

## 2010-08-16 DIAGNOSIS — R209 Unspecified disturbances of skin sensation: Secondary | ICD-10-CM | POA: Insufficient documentation

## 2010-08-16 DIAGNOSIS — G5790 Unspecified mononeuropathy of unspecified lower limb: Secondary | ICD-10-CM | POA: Insufficient documentation

## 2010-08-16 LAB — POCT I-STAT, CHEM 8
BUN: 11 mg/dL (ref 6–23)
Calcium, Ion: 1.04 mmol/L — ABNORMAL LOW (ref 1.12–1.32)
Chloride: 104 mEq/L (ref 96–112)
Creatinine, Ser: 0.8 mg/dL (ref 0.4–1.2)
Glucose, Bld: 88 mg/dL (ref 70–99)
HCT: 37 % (ref 36.0–46.0)
Hemoglobin: 12.6 g/dL (ref 12.0–15.0)
Potassium: 4 mEq/L (ref 3.5–5.1)
Sodium: 138 mEq/L (ref 135–145)
TCO2: 27 mmol/L (ref 0–100)

## 2010-08-30 NOTE — Discharge Summary (Signed)
[name], [name]                 ACCOUNT NO.:  [id number]   MEDICAL RECORD NO.:  [id number]          PATIENT TYPE:  INP   LOCATION:  9119                          FACILITY:  [facility]   PHYSICIAN:  Zenaida Niece, M.D.DATE OF BIRTH:  [birth year]   DATE OF ADMISSION:  09/11/2005  DATE OF DISCHARGE:  09/13/2005                                 DISCHARGE SUMMARY   ADMISSION DIAGNOSIS:  Intrauterine pregnancy at 40 weeks.   DISCHARGE DIAGNOSIS:  Intrauterine pregnancy at 40 weeks and desires  surgical sterility.   PROCEDURES:  On May 31, she had a spontaneous vaginal delivery, and on June  1, she had a postpartum tubal ligation.   HISTORY AND PHYSICAL:  This is a 40 year old white female, gravida 2, para 0-  1-0-1 with an EGA of [redacted] weeks who presents with a complaint of regular  contractions.  Initial evaluation in triage revealed cervix to be 3-4 cm  dilated.  Prenatal care complicated by history of preterm premature rupture  of membranes for which she initially received a couple progesterone shots  during this pregnancy, but then these were discontinued.   PRENATAL LABORATORY DATA:  Blood type is O+ with negative antibody screen,  RPR nonreactive, rubella immune, hepatitis B surface antigen negative,  gonorrhea and chlamydia negative.  Triple screen was normal.  One-hour  Glucola was 131, and group B strep is negative.   PAST GYNECOLOGIC HISTORY:  Significant for cryotherapy in 1997.   PAST OBSTETRICAL HISTORY:  Significant for a vaginal delivery at 35 weeks, 5-  pound 7-ounce baby, complicated by preterm premature rupture of membranes.   HOSPITAL COURSE:  The patient was admitted in labor and continued to  progress on her own.  On Dr. Ebony Hail first exam, she was 2-3, complete, -2  and he performed amniotomy for augmentation.  She did progress in labor and  received an epidural.  On the afternoon of May31, she had a vaginal delivery  of a viable female infant with Apgars of 9 and  9, weight 7 pounds 7 ounces.  Placenta delivered spontaneously and was intact and uterus palpated normal.  She had a second-degree midline laceration which was repaired with 3-0  Vicryl and a small amount of bleeding from the anterior cervix was  controlled also with 3-0 Vicryl.  Postpartum, she had no significant  complications.  Predelivery hemoglobin 12, postdelivery 10.1.  She did wish  to undergo tubal ligation, and this was discussed with her by Dr. Ambrose Mantle.  On the afternoon of June1, she had a postpartum tubal ligation performed  with her epidural.  This was done without complications.  Postoperatively,  she had significant pain at the incision where her tubal was performed.  This prevented discharge on the morning of postpartum day #2.  However, she  was able to try p.o. Dilaudid, and with this, she was able to obtain  adequate pain control, and on the afternoon of June2, she was felt to be  stable enough for discharge home.   DISCHARGE INSTRUCTIONS:  Regular diet, pelvic rest, follow-up in 6 weeks.   MEDICATIONS:  Dilaudid 2 mg #30 one to two p.o. q.4-6h. p.r.n. pain, and she  was given our discharge pamphlet.      Zenaida Niece, M.D.  Electronically Signed     TDM/MEDQ  D:  10/02/2005  T:  10/02/2005  Job:  [id number]

## 2010-08-30 NOTE — Op Note (Signed)
NAMECHRISTABELLA, [name] NO.:  [id number]   MEDICAL RECORD NO.:  [id number]          PATIENT TYPE:  INP   LOCATION:  9119                          FACILITY:  [facility]   PHYSICIAN:  Malachi Pro. Ambrose Mantle, M.D. DATE OF BIRTH:  05-Jul-1970   DATE OF PROCEDURE:  DATE OF DISCHARGE:                                 OPERATIVE REPORT   PREOPERATIVE DIAGNOSIS:  Voluntary sterilization.   POSTOPERATIVE DIAGNOSIS:  Voluntary sterilization.   OPERATION:  Bilateral tubal ligation.   OPERATOR:  Malachi Pro. Ambrose Mantle, M.D.   ANESTHESIA:  Epidural.   The patient was brought to the operating room and the epidural anesthetic  that had been used for delivery was boosted.  It took a long time to become  effective but it did become effective, confirmed by pinching the abdominal  wall with no response.  The skin under the inferior portion of the umbilicus  was incised and this incision was carried in layers down to the fascia.  It  should be noted there appeared to be a small supraumbilical hernia.  Actually, it may have been in the umbilicus but it was above our incision.  I incised the fascia and then entered the peritoneal cavity.  I identified  the right tube and ovary.  The right tube had a normal fimbria.  The right  ovary appeared normal.  I made a window in the mesosalpinx with the Bovie.  I then placed two ties of 0-plain catgut proximally and distally on the  tube, excised the intervening portion of the tube and saved it as a  specimen.  I confirmed the lumen_ .  I did the same procedure on the left  side, taking a slightly shorter amount of tube.  I did not see the left  ovary.  The left tube appeared normal.  Hemostasis was completely adequate.  The abdominal wall was closed with a running suture of 0-Vicryl including  the fascia and peritoneum.  The subcu tissue was not sutured.  The skin was  closed with 3-0 plain catgut interrupted sutures.  Sponge and needle counts  were correct.   And, the patient was returned to recovery in satisfactory  condition.  Blood loss was less than 5 cc.      Malachi Pro. Ambrose Mantle, M.D.  Electronically Signed     TFH/MEDQ  D:  09/12/2005  T:  09/12/2005  Job:  [id number]

## 2010-08-30 NOTE — H&P (Signed)
NAMEJAIANNA, [name] NO.:  [id number]   MEDICAL RECORD NO.:  [id number]          PATIENT TYPE:  INP   LOCATION:  9119                          FACILITY:  [facility]   PHYSICIAN:  Malachi Pro. Ambrose Mantle, M.D. DATE OF BIRTH:  October 25, 1970   DATE OF ADMISSION:  09/11/2005  DATE OF DISCHARGE:                                HISTORY & PHYSICAL   A 40 year old, white, married female, para 1-1-0-2, who requests  sterilization after a full term delivery.  The patient was admitted on Sep 11, 2005, underwent augmented labor under epidural anesthesia and delivered  a 7-pound 7-ounce female vaginally with a second degree midline laceration.   PAST MEDICAL HISTORY:  1.  Revealed no known allergies.  2.  She did have cryotherapy on her cervix in 1997.  3.  She has a history of migraines.   She does not drink, smoke, or take drugs.   FAMILY HISTORY:  Paternal grandfather with high blood pressure, diabetes,  and heart disease.   PHYSICAL EXAMINATION:  VITAL SIGNS:  On admission revealed normal vital  signs.  HEART/LUNGS:  Normal.  ABDOMEN:  Soft and nontender.  PELVIC:  Postpartum the uterus is at the umbilicus.   IMPRESSION:  1.  Intrauterine pregnancy at 40 weeks, delivered.  2.  Request for sterilization.   The patient is prepared for a tubal ligation.      Malachi Pro. Ambrose Mantle, M.D.  Electronically Signed     TFH/MEDQ  D:  09/12/2005  T:  09/12/2005  Job:  [id number]

## 2010-12-23 ENCOUNTER — Encounter: Payer: Self-pay | Admitting: Family Medicine

## 2010-12-23 ENCOUNTER — Ambulatory Visit (INDEPENDENT_AMBULATORY_CARE_PROVIDER_SITE_OTHER): Payer: 59 | Admitting: Family Medicine

## 2010-12-23 VITALS — BP 100/60 | HR 80 | Temp 98.7°F | Resp 12 | Ht 66.0 in | Wt 105.0 lb

## 2010-12-23 DIAGNOSIS — K120 Recurrent oral aphthae: Secondary | ICD-10-CM

## 2010-12-23 MED ORDER — LIDOCAINE VISCOUS 2 % MT SOLN: 20.0000 mL | OROMUCOSAL | Status: AC | PRN

## 2010-12-23 MED ORDER — TRIAMCINOLONE ACETONIDE 0.1 % MT PSTE: PASTE | OROMUCOSAL | Status: DC

## 2010-12-23 NOTE — Patient Instructions (Signed)
Stop Amoxicillin at this time.

## 2010-12-23 NOTE — Progress Notes (Signed)
  Subjective:    Patient ID: [name], female    DOB: 06/01/1970, 40 y.o.   MRN: [id number]  HPI Mouth ulcers. Onset of illness 9 days ago. Initially had some vomiting.  About 8 days ago vomiting resolved but she had some diarrhea. She also developed sore throat and headache. Symptoms progressed and last Wednesday she went to urgent care. Rapid strep negative. Was given some type of steroid shot. Placed on amoxicillin and subsequently developed nausea and recurrent diarrhea. Now has ulcerations on her tongue tip. Pain with drinking and eating. No other skin rash. Denies fever at this time.   Review of Systems  Constitutional: Negative for fever.  HENT: Positive for sore throat. Negative for trouble swallowing.   Respiratory: Negative for cough and shortness of breath.   Skin: Negative for rash.       Objective:   Physical Exam  Constitutional: She appears well-developed and well-nourished. No distress.  HENT:  Right Ear: External ear normal.  Left Ear: External ear normal.       Patient has some aphthous ulcers at the time and otherwise oropharynx clear. No exudate  Neck: Neck supple.  Cardiovascular: Normal rate and regular rhythm.   Pulmonary/Chest: Effort normal and breath sounds normal. No respiratory distress. She has no wheezes. She has no rales.  Lymphadenopathy:    She has no cervical adenopathy.  Skin: No rash noted.          Assessment & Plan:  Probable recent viral syndrome. Aphthous type ulcers currently. Explained these are usually viral. Kenalog in [place]. Viscous Xylocaine as needed for symptomatic relief

## 2011-01-03 ENCOUNTER — Emergency Department (HOSPITAL_COMMUNITY): Payer: 59

## 2011-01-03 ENCOUNTER — Inpatient Hospital Stay (HOSPITAL_COMMUNITY)
Admission: EM | Admit: 2011-01-03 | Discharge: 2011-01-06 | DRG: 391 | Disposition: A | Discharge status: Home / Self Care | Payer: 59 | Attending: Family Medicine | Admitting: Family Medicine

## 2011-01-03 DIAGNOSIS — R112 Nausea with vomiting, unspecified: Secondary | ICD-10-CM | POA: Diagnosis present

## 2011-01-03 DIAGNOSIS — K7689 Other specified diseases of liver: Secondary | ICD-10-CM | POA: Diagnosis present

## 2011-01-03 DIAGNOSIS — J029 Acute pharyngitis, unspecified: Secondary | ICD-10-CM | POA: Diagnosis present

## 2011-01-03 DIAGNOSIS — I959 Hypotension, unspecified: Secondary | ICD-10-CM | POA: Diagnosis present

## 2011-01-03 DIAGNOSIS — E861 Hypovolemia: Secondary | ICD-10-CM | POA: Diagnosis present

## 2011-01-03 DIAGNOSIS — E43 Unspecified severe protein-calorie malnutrition: Secondary | ICD-10-CM | POA: Diagnosis present

## 2011-01-03 DIAGNOSIS — E876 Hypokalemia: Secondary | ICD-10-CM | POA: Diagnosis present

## 2011-01-03 DIAGNOSIS — R197 Diarrhea, unspecified: Secondary | ICD-10-CM | POA: Diagnosis present

## 2011-01-03 DIAGNOSIS — F1010 Alcohol abuse, uncomplicated: Secondary | ICD-10-CM | POA: Diagnosis present

## 2011-01-03 DIAGNOSIS — D649 Anemia, unspecified: Secondary | ICD-10-CM | POA: Diagnosis present

## 2011-01-03 DIAGNOSIS — K5289 Other specified noninfective gastroenteritis and colitis: Principal | ICD-10-CM | POA: Diagnosis present

## 2011-01-03 DIAGNOSIS — R634 Abnormal weight loss: Secondary | ICD-10-CM | POA: Diagnosis present

## 2011-01-03 LAB — URINALYSIS, ROUTINE W REFLEX MICROSCOPIC
Bilirubin Urine: NEGATIVE
Glucose, UA: NEGATIVE mg/dL
Hgb urine dipstick: NEGATIVE
Ketones, ur: >80 mg/dL — AB
Leukocytes, UA: NEGATIVE
Nitrite: NEGATIVE
Protein, ur: 100 mg/dL — AB
Specific Gravity, Urine: 1.02 (ref 1.005–1.030)
Urobilinogen, UA: 1 mg/dL (ref 0.0–1.0)
pH: 8.5 — ABNORMAL HIGH (ref 5.0–8.0)

## 2011-01-03 LAB — DIFFERENTIAL
Basophils Absolute: 0 10*3/uL (ref 0.0–0.1)
Basophils Relative: 0 % (ref 0–1)
Eosinophils Absolute: 0 10*3/uL (ref 0.0–0.7)
Eosinophils Relative: 0 % (ref 0–5)
Lymphocytes Relative: 5 % — ABNORMAL LOW (ref 12–46)
Lymphs Abs: 0.8 10*3/uL (ref 0.7–4.0)
Monocytes Absolute: 1.3 10*3/uL — ABNORMAL HIGH (ref 0.1–1.0)
Monocytes Relative: 8 % (ref 3–12)
Neutro Abs: 14.5 10*3/uL — ABNORMAL HIGH (ref 1.7–7.7)
Neutrophils Relative %: 87 % — ABNORMAL HIGH (ref 43–77)

## 2011-01-03 LAB — COMPREHENSIVE METABOLIC PANEL
ALT: 7 U/L (ref 0–35)
AST: 11 U/L (ref 0–37)
Albumin: 3.2 g/dL — ABNORMAL LOW (ref 3.5–5.2)
Alkaline Phosphatase: 64 U/L (ref 39–117)
BUN: 10 mg/dL (ref 6–23)
CO2: 25 mEq/L (ref 19–32)
Calcium: 7.8 mg/dL — ABNORMAL LOW (ref 8.4–10.5)
Chloride: 104 mEq/L (ref 96–112)
Creatinine, Ser: 0.48 mg/dL — ABNORMAL LOW (ref 0.50–1.10)
GFR calc Af Amer: >60 mL/min (ref >60)
GFR calc non Af Amer: >60 mL/min (ref >60)
Glucose, Bld: 107 mg/dL — ABNORMAL HIGH (ref 70–99)
Potassium: 3.1 mEq/L — ABNORMAL LOW (ref 3.5–5.1)
Sodium: 140 mEq/L (ref 135–145)
Total Bilirubin: 0.7 mg/dL (ref 0.3–1.2)
Total Protein: 6.7 g/dL (ref 6.0–8.3)

## 2011-01-03 LAB — GLUCOSE, CAPILLARY
Glucose-Capillary: 99 mg/dL (ref 70–99)
Glucose-Capillary: 68 mg/dL — ABNORMAL LOW (ref 70–99)

## 2011-01-03 LAB — CBC
HCT: 33.2 % — ABNORMAL LOW (ref 36.0–46.0)
Hemoglobin: 10.7 g/dL — ABNORMAL LOW (ref 12.0–15.0)
MCH: 29.6 pg (ref 26.0–34.0)
MCHC: 32.2 g/dL (ref 30.0–36.0)
MCV: 91.7 fL (ref 78.0–100.0)
Platelets: 331 10*3/uL (ref 150–400)
RBC: 3.62 MIL/uL — ABNORMAL LOW (ref 3.87–5.11)
RDW: 16.7 % — ABNORMAL HIGH (ref 11.5–15.5)
WBC: 16.6 10*3/uL — ABNORMAL HIGH (ref 4.0–10.5)

## 2011-01-03 LAB — WET PREP, GENITAL
Clue Cells Wet Prep HPF POC: NONE SEEN
Trich, Wet Prep: NONE SEEN
Yeast Wet Prep HPF POC: NONE SEEN

## 2011-01-03 LAB — LIPASE, BLOOD: Lipase: 276 U/L — ABNORMAL HIGH (ref 11–59)

## 2011-01-03 LAB — URINE MICROSCOPIC-ADD ON

## 2011-01-03 LAB — LACTIC ACID, PLASMA: Lactic Acid, Venous: 1 mmol/L (ref 0.5–2.2)

## 2011-01-03 LAB — MAGNESIUM: Magnesium: 1.9 mg/dL (ref 1.5–2.5)

## 2011-01-03 LAB — POCT PREGNANCY, URINE: Preg Test, Ur: NEGATIVE

## 2011-01-03 LAB — RAPID STREP SCREEN (MED CTR MEBANE ONLY): Streptococcus, Group A Screen (Direct): NEGATIVE

## 2011-01-03 MED ORDER — IOHEXOL 300 MG/ML  SOLN
100.0000 mL | Freq: Once | INTRAMUSCULAR | Status: AC | PRN
  Administered 2011-01-03: 100 mL via INTRAVENOUS

## 2011-01-04 LAB — HEMOGLOBIN A1C
Hgb A1c MFr Bld: 5.6 % (ref <5.7)
Mean Plasma Glucose: 114 mg/dL (ref <117)

## 2011-01-04 LAB — URINE CULTURE
Colony Count: 15000
Culture  Setup Time: 201209211115

## 2011-01-04 LAB — DIFFERENTIAL
Basophils Absolute: 0 10*3/uL (ref 0.0–0.1)
Basophils Relative: 0 % (ref 0–1)
Eosinophils Absolute: 0 10*3/uL (ref 0.0–0.7)
Eosinophils Relative: 0 % (ref 0–5)
Lymphocytes Relative: 23 % (ref 12–46)
Lymphs Abs: 1.5 10*3/uL (ref 0.7–4.0)
Monocytes Absolute: 0.6 10*3/uL (ref 0.1–1.0)
Monocytes Relative: 10 % (ref 3–12)
Neutro Abs: 4.2 10*3/uL (ref 1.7–7.7)
Neutrophils Relative %: 66 % (ref 43–77)

## 2011-01-04 LAB — GC/CHLAMYDIA PROBE AMP, GENITAL
Chlamydia, DNA Probe: NEGATIVE
GC Probe Amp, Genital: NEGATIVE

## 2011-01-04 LAB — LIPID PANEL
Cholesterol: 107 mg/dL (ref 0–200)
HDL: 38 mg/dL — ABNORMAL LOW (ref >39)
LDL Cholesterol: 55 mg/dL (ref 0–99)
Total CHOL/HDL Ratio: 2.8 RATIO
Triglycerides: 68 mg/dL (ref <150)
VLDL: 14 mg/dL (ref 0–40)

## 2011-01-04 LAB — GLUCOSE, CAPILLARY
Glucose-Capillary: 100 mg/dL — ABNORMAL HIGH (ref 70–99)
Glucose-Capillary: 106 mg/dL — ABNORMAL HIGH (ref 70–99)
Glucose-Capillary: 110 mg/dL — ABNORMAL HIGH (ref 70–99)
Glucose-Capillary: 126 mg/dL — ABNORMAL HIGH (ref 70–99)
Glucose-Capillary: 92 mg/dL (ref 70–99)

## 2011-01-04 LAB — COMPREHENSIVE METABOLIC PANEL
ALT: 6 U/L (ref 0–35)
AST: 10 U/L (ref 0–37)
Albumin: 2.2 g/dL — ABNORMAL LOW (ref 3.5–5.2)
Alkaline Phosphatase: 46 U/L (ref 39–117)
BUN: 5 mg/dL — ABNORMAL LOW (ref 6–23)
CO2: 23 mEq/L (ref 19–32)
Calcium: 7.5 mg/dL — ABNORMAL LOW (ref 8.4–10.5)
Chloride: 110 mEq/L (ref 96–112)
Creatinine, Ser: <0.47 mg/dL — ABNORMAL LOW (ref 0.50–1.10)
Glucose, Bld: 124 mg/dL — ABNORMAL HIGH (ref 70–99)
Potassium: 3.4 mEq/L — ABNORMAL LOW (ref 3.5–5.1)
Sodium: 139 mEq/L (ref 135–145)
Total Bilirubin: 0.5 mg/dL (ref 0.3–1.2)
Total Protein: 5 g/dL — ABNORMAL LOW (ref 6.0–8.3)

## 2011-01-04 LAB — CBC
HCT: 28.8 % — ABNORMAL LOW (ref 36.0–46.0)
Hemoglobin: 9.3 g/dL — ABNORMAL LOW (ref 12.0–15.0)
MCH: 29.9 pg (ref 26.0–34.0)
MCHC: 32.3 g/dL (ref 30.0–36.0)
MCV: 92.6 fL (ref 78.0–100.0)
Platelets: 226 10*3/uL (ref 150–400)
RBC: 3.11 MIL/uL — ABNORMAL LOW (ref 3.87–5.11)
RDW: 17.2 % — ABNORMAL HIGH (ref 11.5–15.5)
WBC: 6.3 10*3/uL (ref 4.0–10.5)

## 2011-01-04 LAB — HIV ANTIBODY (ROUTINE TESTING W REFLEX): HIV: NONREACTIVE

## 2011-01-04 LAB — LIPASE, BLOOD: Lipase: 46 U/L (ref 11–59)

## 2011-01-05 LAB — CBC
HCT: 27.8 % — ABNORMAL LOW (ref 36.0–46.0)
Hemoglobin: 8.9 g/dL — ABNORMAL LOW (ref 12.0–15.0)
MCH: 29.7 pg (ref 26.0–34.0)
MCHC: 32 g/dL (ref 30.0–36.0)
MCV: 92.7 fL (ref 78.0–100.0)
Platelets: 249 10*3/uL (ref 150–400)
RBC: 3 MIL/uL — ABNORMAL LOW (ref 3.87–5.11)
RDW: 17.2 % — ABNORMAL HIGH (ref 11.5–15.5)
WBC: 5.7 10*3/uL (ref 4.0–10.5)

## 2011-01-05 LAB — COMPREHENSIVE METABOLIC PANEL
ALT: 7 U/L (ref 0–35)
AST: 11 U/L (ref 0–37)
Albumin: 2.2 g/dL — ABNORMAL LOW (ref 3.5–5.2)
Alkaline Phosphatase: 44 U/L (ref 39–117)
BUN: <3 mg/dL — ABNORMAL LOW (ref 6–23)
CO2: 23 mEq/L (ref 19–32)
Calcium: 7.4 mg/dL — ABNORMAL LOW (ref 8.4–10.5)
Chloride: 110 mEq/L (ref 96–112)
Creatinine, Ser: <0.47 mg/dL — ABNORMAL LOW (ref 0.50–1.10)
Glucose, Bld: 107 mg/dL — ABNORMAL HIGH (ref 70–99)
Potassium: 4.1 mEq/L (ref 3.5–5.1)
Sodium: 138 mEq/L (ref 135–145)
Total Bilirubin: 0.3 mg/dL (ref 0.3–1.2)
Total Protein: 4.9 g/dL — ABNORMAL LOW (ref 6.0–8.3)

## 2011-01-05 LAB — GLUCOSE, CAPILLARY
Glucose-Capillary: 110 mg/dL — ABNORMAL HIGH (ref 70–99)
Glucose-Capillary: 78 mg/dL (ref 70–99)
Glucose-Capillary: 86 mg/dL (ref 70–99)
Glucose-Capillary: 95 mg/dL (ref 70–99)

## 2011-01-05 LAB — DIFFERENTIAL
Basophils Absolute: 0 10*3/uL (ref 0.0–0.1)
Basophils Relative: 0 % (ref 0–1)
Eosinophils Absolute: 0.1 10*3/uL (ref 0.0–0.7)
Eosinophils Relative: 1 % (ref 0–5)
Lymphocytes Relative: 32 % (ref 12–46)
Lymphs Abs: 1.8 10*3/uL (ref 0.7–4.0)
Monocytes Absolute: 0.7 10*3/uL (ref 0.1–1.0)
Monocytes Relative: 12 % (ref 3–12)
Neutro Abs: 3.1 10*3/uL (ref 1.7–7.7)
Neutrophils Relative %: 54 % (ref 43–77)

## 2011-01-05 LAB — HEPATITIS PANEL, ACUTE
HCV Ab: NEGATIVE
Hep A IgM: NEGATIVE
Hep B C IgM: NEGATIVE
Hepatitis B Surface Ag: NEGATIVE

## 2011-01-05 LAB — LIPASE, BLOOD: Lipase: 43 U/L (ref 11–59)

## 2011-01-06 ENCOUNTER — Inpatient Hospital Stay (HOSPITAL_COMMUNITY): Payer: 59

## 2011-01-06 LAB — GLUCOSE, CAPILLARY
Glucose-Capillary: 79 mg/dL (ref 70–99)
Glucose-Capillary: 118 mg/dL — ABNORMAL HIGH (ref 70–99)

## 2011-01-07 NOTE — Discharge Summary (Signed)
NAMESHOLANDA, [name] NO.:  [id number]  MEDICAL RECORD NO.:  [id number]  LOCATION:  1304                         FACILITY:  [facility]  PHYSICIAN:  Judith Sacks, MD    DATE OF BIRTH:  [birth year]  DATE OF ADMISSION:  01/03/2011 DATE OF DISCHARGE:  01/06/2011                              DISCHARGE SUMMARY   PRIMARY CARE PHYSICIAN:  [name], M.D.  CONDITION ON DISCHARGE:  Improved.  DISPOSITION:  Home.  DISCHARGE DIAGNOSES: 1. Nausea, vomiting, and diarrhea, favor multifactorial etiology     including possibly viral as well as stress. 2. Normocytic anemia, stable. 3. Alcohol abuse, reportedly resolved. 4. Weight loss secondary to nausea and vomiting, diarrhea as well as     slightly depression. 5. Liver cysts. 6. Severe protein-calorie malnutrition of acute illness.  HISTORY OF PRESENT ILLNESS:  This is [name] 40 year old woman who presented with nausea and vomiting and diarrhea.  She has been sick for approximately 3 weeks and had lost quite [name] bit of weight. She presented dehydrated.  HOSPITAL COURSE:  [name] was admitted to medical floor, treated with IV fluids, and further evaluated.  Imaging was unremarkable for acute etiology for her diarrhea, although did show multiple low-density lesions in the liver, which could not be fully characterized.  These were felt to be noncontributory.  She never had any diarrhea while in the hospital and so no stool studies were able to be obtained.  Her nausea and vomiting also quickly resolved.  Her serum lipase was elevated on admission, but within normal limits the following day.  It is possible that she might have had [name] couple of bouts of mild pancreatitis.  However, [name] CT imaging showed [name] normal pancreas.  Of note, she had been abusing alcohol for the last year and [name] half as she has had quite [name] bit of stress in her life and is going through divorce. However, she reports she has had very little alcohol in the  last month. She was seen by Nutrition, Resource Breeze was recommended for her severe protein-calorie malnutrition.  At this point, the patient has been able to eat and keep fluids down, feels much better, has minimal nausea and would like to go home.  CONSULTATIONS:  None.  PROCEDURES:  None.  IMAGING: 1. Chest x-ray, September 21st:  No acute disease. 2. CT of the abdomen and pelvis, September 21st:  Edematous thickening     of the gastric body could represent gastritis. 3. Cystic lesion in the left adnexa, likely represents [name] functional     ovarian cyst.  Appendix not identified, but no secondary signs of     acute appendicitis. 4. Multiple low-density lesions in the liver, cannot be fully     characterize, recommend nonemergent MRI abdomen with and without     contrast.  DISCUSSION:  The patient has been under extraordinary amount of stress, is currently going through depression and continues to work full time. Her family feels that she has not been taking care of herself and attributes [name] lot of her symptoms to stress, which she has been coping with with difficulty.  Her workup here in the hospital was  unremarkable except for liver cyst, which again felt to be noncontributory at this point.  Her symptoms are most likely related to stress as well as possible viral illness.  Also in the differential would be continued occult alcohol use, although the patient seems to be quite honest about past problems.  At this point, she seems to be much improved, she is tolerating [name] diet and vomiting has resolved.  She has had no diarrhea and I think she has reached maximum medical benefit.  However, we recommend she follow up with her primary care physician in the next week for followup.  She is agreeable to this.  We will keep her on PPI therapy at this point as she did have some gastritis, which could be secondary to her viral illness or even [name] gastritis perhaps related to alcohol.  If  her symptoms recur, could consider outpatient GI evaluation, although she does not give [name] history suggestive of peptic ulcer disease or irritable or inflammatory bowel disease.  MICROBIOLOGY: 1. Urine culture, 15,000 colonies, multiple bacterial morphotypes     present, none predominant. 2. Blood cultures x2, September 21st, no growth to date.  PERTINENT LABORATORY STUDIES: 1. [name] wet prep done in the emergency room was essentially negative. 2. Urine pregnancy was negative. 3. Initial lipase was 276, the following day was 46. 4. Hemoglobin A1c was within normal limits. 5. GC and chlamydia probes were negative.  The patient has had no     discharge.  It is unclear why these examinations were performed. 6. HIV antibody was nonreactive. 7. Acute hepatitis panel was negative.  Her hepatitis B surface     antigen and hepatitis B core antibody.  Hepatitis [name] and hepatitis C     also negative. 8. CBC notable for [name] hemoglobin of 8.9.  There is no microcytosis. 9. Basic metabolic panel is unremarkable. 10.Hepatic function panel was unremarkable.  DISCHARGE INSTRUCTIONS:  The patient will be discharged home today.  DIET:  Bland diet as desired.  ACTIVITIES:  Unrestricted.  She may return to work on October 1st.  DISCHARGE MEDICATIONS:  New, 1. Colace 100 mg p.o. b.i.d., use to prevent constipation while on     iron, stop if diarrhea occurs. 2. Iron sulfate 325 mg p.o. b.i.d.  Note, this will cause stool to     turn dark and may cause constipation. 3. Multivitamin p.o. daily. 4. Resource peach liquid p.o. t.i.d. 5. Zofran 4 mg by mouth every 6 hours as needed for nausea or     vomiting. 6. Potassium chloride 20 mEq p.o. daily for the next 2 weeks. 7. Protonix 40 mg p.o. daily for the next 2 weeks.  Discontinue aspirin, which may have contributed to her gastritis.  TIME COORDINATING DISCHARGE:  DICTATION ENDS AT THIS POINT     Judith Sacks, MD     DG/MEDQ  D:  01/06/2011   T:  01/07/2011  Job:  [id number]  cc:   [name], M.D.  Electronically Signed by [name]  on 01/07/2011 10:31:41 PM

## 2011-01-07 NOTE — H&P (Signed)
[name], STREIGHT NO.:  [id number]  MEDICAL RECORD NO.:  [id number]  LOCATION:  1304                         FACILITY:  [facility]  PHYSICIAN:  Alvino Blood, MD      DATE OF BIRTH:  03/06/1971  DATE OF ADMISSION:  01/03/2011 DATE OF DISCHARGE:                             HISTORY & PHYSICAL   CHIEF COMPLAINT:  Nausea, vomiting, and diarrhea.  HISTORY OF PRESENT ILLNESS:  [name] is a 40 year old Caucasian female who has a history of migraine headaches, who presented to the emergency room today with nausea and vomiting and diarrhea which started about 2- 1/2 to 3 weeks ago.  She has had multiple episodes of emesis, which are nonbloody, but greenish in color, stating that she is unable to keep anything down.  She also reports 3 to 4 episodes of diarrhea stools without any mucus or blood in them, associated with intermittent abdominal pain which is relieved when she lay still and draws up both legs.  She has visited her primary care provider's office as well as the Urgent Care on several occasions since the onset and about a week ago, she also developed sore throat with swollen lymph glands in her neck with difficulty swallowing.  At that point, a throat strep screen was done, which was apparently negative.  She was; however, started on amoxicillin.  However, these appear to make her nausea, vomiting, and diarrhea worse.  After 3 days, she called her primary care provider who informed her to stop the antibiotics as the strep screen was negative. The patient feels dehydrated, has general weakness, and dizziness especially when she stands up, but no syncopal episodes.  She also admits to an episode of fever with intermittent chills about a week ago and recorded a temperature of about 102 at that time but this appears to have resolved.  She denies any chest pains, palpitations, shortness of breath, cough, urinary symptoms such as dysuria or frequency.  In  the emergency room, CBC was noted to be abnormal as her white cell count was elevated at 16.6 and hemoglobin and hematocrit were 10.7 and 33.2 respectively.  Her potassium was low in the chemistry panel and her lipase was also elevated at 276.  Urinalysis was negative for infection. A CT of the abdomen and pelvis showed edematous thickening of the stomach consistent with gastritis.  There is also incidental finding of multiple low density lesions in the liver of unclear etiology with cystic lesion in the left adnexa consistent with ovarian cyst.  She was given IV antiemetics, IV fluid hydration, and then referred to the medical service for further management.  PAST MEDICAL HISTORY: 1. Migraine headaches. 2. Normal vaginal delivery. 3. Recent treatment for pharyngitis with amoxicillin.  PAST SURGICAL HISTORY: 1. Bilateral tubal ligation. 2. 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  ALLERGIES:  No known drug allergies.  MEDICATIONS: 1. Aspirin 81 mg orally daily.  FAMILY HISTORY:  Her mother has hypertension.  Grandmother had lung cancer.  She was a smoker. A grandfather had hypertension and diabetes.  She has a niece who has rhabdosarcoma.  Her father appears to be in good health.  SOCIAL HISTORY:  She is separated, has 2 children, works in a Psychiatric nurse.  Denies use of tobacco or illicit drugs.  She occasionally drinks alcohol. Her Family pfysician is Dr. Evelena Peat.  REVIEW OF SYSTEMS:  She admits to some headaches and also anorexia for the past few days and some difficulty breathing occasionally, otherwise all systems are reviewed and are negative except as mentioned in the history of present illness.  The patient also admits to 14 pounds weight loss over the past 2 weeks.  PHYSICAL EXAMINATION:  VITAL SIGNS: Temperature 99.2, pulse 72 per minute, respiratory rate 23 per minute, blood pressure 88/51, oxygen saturation  100% on room air. GENERAL EXAMINATION:  This is a young Caucasian female who appears ill looking, but not in any painful distress. HEENT: Examination of the head is normocephalic and atraumatic.  Pupils are equal and reactive to light and accommodation.  There is equal ocular movement bilaterally.  The external ears and nose appears normal. The oropharyngeal mucosa is dry with evidence of pharyngeal erythema and exudate. NECK: Supple with tender lymphadenopathy without any evidence of thyromegaly or jugular venous distention. CHEST: Clear to auscultation without any wheeze or rales.  There is good anterior respiratory effort bilaterally. CARDIOVASCULAR: Normal first and second heart sounds are heard, which are regular rate and rhythm without any murmurs, rubs or gallops. Abdomen: Soft with right upper quadrant and epigastric tenderness, but no rebound tenderness or guarding.  Bowel sounds are present and there is no palpable hepatosplenomegaly. CNS: She is alert and oriented x3 without any focal neurological deficits. EXTREMITIES: The peripheral pulses are palpable.  There is no evidence of peripheral edema, calf tenderness, or tenosynovitis.  SKIN: Warm and dry without any active rash or lesions.  LABORATORY DATA:  CBC:  White cell count is elevated at 16.6, hemoglobin is 10.7 and hematocrit 33.2, platelet count is 331, neutrophils 87%, MCV 91.7.  Metabolic panel:  Sodium 140, potassium is low at 3.1, chloride 104, CO2 of 25, BUN 107, creatinine 1, glucose 107, calcium 7.9, albumin 3.2.  Liver function tests:  Alkaline phosphatase 64, total bilirubin 0.7, AST 11, ALT 7.  Lactic acid level is 1, lipase is elevated to 2786. Urinalysis shows turbid urine with pH of 8.5, ketones greater than 80, protein 100, bacteria many, WBC 0-2, RBC 3 to 6, nitrite and leukocyte negative.  IMAGING STUDIES: 1. Chest x-ray, this does not show any evidence of acute     cardiopulmonary disease. 2. CT  of the abdomen and pelvis with contrast shows edematous     thickening of the gastric body, which could represent gastritis,     cystic lesion in the left adnexa likely represent a functional     ovarian cyst, the appendix is not identified but no secondary signs     of acute appendicitis noted, multiple low density lesions in the     liver cannot be fully characterized.  Recommend nonemergency MRI of     the abdomen with and without contrast.  ASSESSMENT: 1. Acute gastroenteritis, possibly infectious viral to rule out     Clostridium difficile colitis. 2. Clinical dehydration with hypovolemia and hypotension. 3. Probable acute pancreatitis with elevated lipase level. 4. Hypokalemia. 5. Pharyngitis, probably viral. 6. Multiple low density lesions in the liver of unclear etiology.  PLAN:  The patient will be admitted to the medical service for further management, which will include; 1. She will be placed on  nil by mouth.  IV fluid hydration will be     given, IV Zofran will be given for nausea and vomiting, and     analgesia for abdominal pain. 2. Her low serum potassium will be repleted with potassium chloride. 3. Stool studies including stool for WBC, Hemoccult, cultures and     sensitivities, ova and parasites, and Clostridium difficile will be     done.  We will withhold empiric use of antibiotics for now until     the stool study results. 4. Her electrolytes and CBC will be monitored. 5. A nonemergent MRI of the liver will be done once the patient is     stable.  The patient's condition is guarded and she is a full code.          ______________________________ Alvino Blood, MD     SI/MEDQ  D:  01/03/2011  T:  01/03/2011  Job:  [id number]  Electronically Signed by Alvino Blood MD on 01/07/2011 06:07:31 PM

## 2011-01-10 LAB — CULTURE, BLOOD (ROUTINE X 2)
Culture  Setup Time: 201209220049
Culture  Setup Time: 201209220050
Culture: NO GROWTH
Culture: NO GROWTH

## 2011-12-19 ENCOUNTER — Encounter: Payer: Self-pay | Admitting: Internal Medicine

## 2011-12-19 ENCOUNTER — Ambulatory Visit (INDEPENDENT_AMBULATORY_CARE_PROVIDER_SITE_OTHER): Payer: 59 | Admitting: Internal Medicine

## 2011-12-19 VITALS — BP 100/70 | Temp 97.5°F | Wt 104.0 lb

## 2011-12-19 DIAGNOSIS — R109 Unspecified abdominal pain: Secondary | ICD-10-CM

## 2011-12-19 DIAGNOSIS — I959 Hypotension, unspecified: Secondary | ICD-10-CM

## 2011-12-19 DIAGNOSIS — R5381 Other malaise: Secondary | ICD-10-CM

## 2011-12-19 DIAGNOSIS — R112 Nausea with vomiting, unspecified: Secondary | ICD-10-CM

## 2011-12-19 MED ORDER — MEPERIDINE HCL 25 MG/ML IJ SOLN
25.0000 mg | Freq: Once | INTRAMUSCULAR | Status: AC
  Administered 2011-12-19: 25 mg via INTRAMUSCULAR

## 2011-12-19 MED ORDER — PROMETHAZINE HCL 25 MG/ML IJ SOLN
25.0000 mg | Freq: Once | INTRAMUSCULAR | Status: AC
  Administered 2011-12-19: 25 mg via INTRAMUSCULAR

## 2011-12-19 MED ORDER — HYDROCODONE-ACETAMINOPHEN 5-500 MG PO TABS: 1.0000 | ORAL_TABLET | Freq: Three times a day (TID) | ORAL | Status: AC | PRN

## 2011-12-19 MED ORDER — PROMETHAZINE HCL 25 MG RE SUPP: 25.0000 mg | Freq: Four times a day (QID) | RECTAL | Status: DC | PRN

## 2011-12-19 NOTE — Progress Notes (Signed)
Subjective:    Patient ID: [name], female    DOB: [birth year], 41 y.o.   MRN: [id number]  HPI  41 year old patient who is seen today with a three-day history of intractable nausea vomiting and diarrhea. She had a similar acute illness in the past he required hospitalization. She was felt to possibly have had mild pancreatitis at that time. 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 She has become progressively weak and has been able to work for the past 2 days. She has been able to tolerate some Gatorade but has not been able to tolerate soup. Diarrhea has improved and she has had perhaps 3 loose bowel movements throughout the day today. She also does describe some very mild right-sided abdominal discomfort. She is accompanied by her mother today. She states that her blood pressure usually runs low and her weight is usually in the 110-112 region  Past Medical History  Diagnosis Date  . HYPOKALEMIA 10/17/2009  . LOW BLOOD PRESSURE 10/17/2009  . SINUSITIS, ACUTE 03/07/2009  . 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 UNSPEC DISORDERS BURSAE&TENDONS SHOULDER REGION 11/09/2008  . FATIGUE 05/04/2009  . NIGHT SWEATS 05/04/2009  . Polydipsia 05/04/2009  . SHINGLES, HX OF 11/24/2008  . Headache     History   Social History  . Marital Status: Married    Spouse Name: N/A    Number of Children: N/A  . Years of Education: N/A   Occupational History  . Not on file.   Social History Main Topics  . Smoking status: Never Smoker   . Smokeless tobacco: Not on file  . Alcohol Use: Not on file  . Drug Use: Not on file  . Sexually Active: Not on file   Other Topics Concern  . Not on file   Social History Narrative  . No narrative on file    Past Surgical History  Procedure Date  . Tubal ligation 2007  . External auditory canal reconstruction     tubes as a [place]    Family History  Problem Relation Age of Onset  . Arthritis Other   . Hyperlipidemia Other   . Hypertension Other   . Cancer  Other     lung  . Diabetes Other     No Known Allergies  Current Outpatient Prescriptions on File Prior to Visit  Medication Sig Dispense Refill  . triamcinolone (KENALOG) 0.1 % paste Place onto mouth ulcers twice daily  5 g  12  . promethazine (PHENERGAN) 25 MG suppository Place 1 suppository (25 mg total) rectally every 6 (six) hours as needed for nausea.  12 each  0   No current facility-administered medications on file prior to visit.    BP 100/70  Temp 97.5 F (36.4 C) (Oral)  Wt 104 lb (47.174 kg)     Review of Systems  Constitutional: Positive for activity change, appetite change, fatigue and unexpected weight change.  HENT: Negative for hearing loss, congestion, sore throat, rhinorrhea, dental problem, sinus pressure and tinnitus.   Eyes: Negative for pain, discharge and visual disturbance.  Respiratory: Negative for cough and shortness of breath.   Cardiovascular: Negative for chest pain, palpitations and leg swelling.  Gastrointestinal: Positive for nausea, abdominal pain and diarrhea. Negative for vomiting, constipation, blood in stool and abdominal distention.  Genitourinary: Negative for dysuria, urgency, frequency, hematuria, flank pain, vaginal bleeding, vaginal discharge, difficulty urinating, vaginal pain and pelvic pain.  Musculoskeletal: Negative for joint swelling, arthralgias and gait problem.  Skin:  Negative for rash.  Neurological: Positive for weakness. Negative for dizziness, syncope, speech difficulty, numbness and headaches.  Hematological: Negative for adenopathy.  Psychiatric/Behavioral: Negative for behavioral problems, dysphoric mood and agitation. The patient is not nervous/anxious.        Objective:   Physical Exam  Constitutional: She is oriented to person, place, and time. She appears well-developed and well-nourished. No distress.       Alert talkative Blood pressure 100/70 supine Blood pressure 95/65 sitting with legs dependent  No  tachycardia  HENT:  Head: Normocephalic.  Right Ear: External ear normal.  Left Ear: External ear normal.  Mouth/Throat: Oropharynx is clear and moist.       Oropharynx appears well hydrated  Eyes: Conjunctivae and EOM are normal. Pupils are equal, round, and reactive to light.  Neck: Normal range of motion. Neck supple. No thyromegaly present.  Cardiovascular: Normal rate, regular rhythm, normal heart sounds and intact distal pulses.        Rate 70  Pulmonary/Chest: Effort normal and breath sounds normal.  Abdominal: Soft. Bowel sounds are normal. She exhibits no distension and no mass. There is no tenderness. There is no rebound and no guarding.  Musculoskeletal: Normal range of motion.  Lymphadenopathy:    She has no cervical adenopathy.  Neurological: She is alert and oriented to person, place, and time.  Skin: Skin is warm and dry. No rash noted.  Psychiatric: She has a normal mood and affect. Her behavior is normal.          Assessment & Plan:    Probable viral gastroenteritis. The patient is accompanied by her mother who states that she is able to care for her over the weekend until she improves. We'll treat symptomatically with anti-emetics and slowly advance diet. If there is any clinical worsening of the weekend she will report to the ED for evaluation and consideration of admission. A note to excuse from work due to this acute illness was dictated

## 2011-12-19 NOTE — Patient Instructions (Addendum)
The main concern with gastroenteritis is dehydration.  Drink  plenty of  fluids, and advance your diet  slowly to solids as you feel improved.  If you are unable to keep anything down or  Show  signs of dehydration such as dry, cracked lips, or  not urinating, please notify our office or report to ED for evaluationViral Gastroenteritis Viral gastroenteritis is also known as stomach flu. This condition affects the stomach and intestinal tract. It can cause sudden diarrhea and vomiting. The illness typically lasts 3 to 8 days. Most people develop an immune response that eventually gets rid of the virus. While this natural response develops, the virus can make you quite ill. CAUSES   Many different viruses can cause gastroenteritis, such as rotavirus or noroviruses. You can catch one of these viruses by consuming contaminated food or water. You may also catch a virus by sharing utensils or other personal items with an infected person or by touching a contaminated surface. SYMPTOMS   The most common symptoms are diarrhea and vomiting. These problems can cause a severe loss of body fluids (dehydration) and a body salt (electrolyte) imbalance. Other symptoms may include:  Fever.   Headache.   Fatigue.   Abdominal pain.  DIAGNOSIS   Your caregiver can usually diagnose viral gastroenteritis based on your symptoms and a physical exam. A stool sample may also be taken to test for the presence of viruses or other infections. TREATMENT   This illness typically goes away on its own. Treatments are aimed at rehydration. The most serious cases of viral gastroenteritis involve vomiting so severely that you are not able to keep fluids down. In these cases, fluids must be given through an intravenous line (IV). HOME CARE INSTRUCTIONS    Drink enough fluids to keep your urine clear or pale yellow. Drink small amounts of fluids frequently and increase the amounts as tolerated.   Ask your caregiver for specific  rehydration instructions.   Avoid:   Foods high in sugar.   Alcohol.   Carbonated drinks.   Tobacco.   Juice.   Caffeine drinks.   Extremely hot or cold fluids.   Fatty, greasy foods.   Too much intake of anything at one time.   Dairy products until 24 to 48 hours after diarrhea stops.   You may consume probiotics. Probiotics are active cultures of beneficial bacteria. They may lessen the amount and number of diarrheal stools in adults. Probiotics can be found in yogurt with active cultures and in supplements.   Wash your hands well to avoid spreading the virus.   Only take over-the-counter or prescription medicines for pain, discomfort, or fever as directed by your caregiver. Do not give aspirin to children. Antidiarrheal medicines are not recommended.   Ask your caregiver if you should continue to take your regular prescribed and over-the-counter medicines.   Keep all follow-up appointments as directed by your caregiver.  SEEK IMMEDIATE MEDICAL CARE IF:    You are unable to keep fluids down.   You do not urinate at least once every 6 to 8 hours.   You develop shortness of breath.   You notice blood in your stool or vomit. This may look like coffee grounds.   You have abdominal pain that increases or is concentrated in one small area (localized).   You have persistent vomiting or diarrhea.   You have a fever.   The patient is a child younger than 3 months, and he or she has a  fever.   The patient is a child older than 3 months, and he or she has a fever and persistent symptoms.   The patient is a child older than 3 months, and he or she has a fever and symptoms suddenly get worse.   The patient is a baby, and he or she has no tears when crying.  MAKE SURE YOU:    Understand these instructions.   Will watch your condition.   Will get help right away if you are not doing well or get worse.  Document Released: 03/31/2005 Document Revised: 03/20/2011  Document Reviewed: 01/15/2011 [facility] Patient Information 2012 [place], [place].

## 2012-04-21 ENCOUNTER — Encounter: Payer: Self-pay | Admitting: Internal Medicine

## 2012-04-21 ENCOUNTER — Ambulatory Visit: Payer: 59 | Admitting: Internal Medicine

## 2012-04-21 ENCOUNTER — Ambulatory Visit (INDEPENDENT_AMBULATORY_CARE_PROVIDER_SITE_OTHER): Payer: BC Managed Care – PPO | Admitting: Internal Medicine

## 2012-04-21 VITALS — BP 114/72 | HR 77 | Temp 98.8°F | Wt 113.0 lb

## 2012-04-21 DIAGNOSIS — M25511 Pain in right shoulder: Secondary | ICD-10-CM | POA: Insufficient documentation

## 2012-04-21 DIAGNOSIS — R5381 Other malaise: Secondary | ICD-10-CM

## 2012-04-21 DIAGNOSIS — M25519 Pain in unspecified shoulder: Secondary | ICD-10-CM

## 2012-04-21 DIAGNOSIS — R42 Dizziness and giddiness: Secondary | ICD-10-CM

## 2012-04-21 DIAGNOSIS — R5383 Other fatigue: Secondary | ICD-10-CM

## 2012-04-21 DIAGNOSIS — R110 Nausea: Secondary | ICD-10-CM

## 2012-04-21 LAB — HEPATIC FUNCTION PANEL
ALT: 16 U/L (ref 0–35)
AST: 21 U/L (ref 0–37)
Albumin: 3.5 g/dL (ref 3.5–5.2)
Alkaline Phosphatase: 82 U/L (ref 39–117)
Bilirubin, Direct: 0 mg/dL (ref 0.0–0.3)
Total Bilirubin: 0.5 mg/dL (ref 0.3–1.2)
Total Protein: 6.8 g/dL (ref 6.0–8.3)

## 2012-04-21 LAB — CBC WITH DIFFERENTIAL/PLATELET
Basophils Absolute: 0 10*3/uL (ref 0.0–0.1)
Basophils Relative: 0.4 % (ref 0.0–3.0)
Eosinophils Absolute: 0 10*3/uL (ref 0.0–0.7)
Eosinophils Relative: 0.2 % (ref 0.0–5.0)
HCT: 29.8 % — ABNORMAL LOW (ref 36.0–46.0)
Hemoglobin: 9.6 g/dL — ABNORMAL LOW (ref 12.0–15.0)
Lymphocytes Relative: 16.8 % (ref 12.0–46.0)
Lymphs Abs: 1.3 10*3/uL (ref 0.7–4.0)
MCHC: 32.1 g/dL (ref 30.0–36.0)
MCV: 83.9 fl (ref 78.0–100.0)
Monocytes Absolute: 0.6 10*3/uL (ref 0.1–1.0)
Monocytes Relative: 7.6 % (ref 3.0–12.0)
Neutro Abs: 6 10*3/uL (ref 1.4–7.7)
Neutrophils Relative %: 75 % (ref 43.0–77.0)
Platelets: 351 10*3/uL (ref 150.0–400.0)
RBC: 3.55 Mil/uL — ABNORMAL LOW (ref 3.87–5.11)
RDW: 17.1 % — ABNORMAL HIGH (ref 11.5–14.6)
WBC: 7.9 10*3/uL (ref 4.5–10.5)

## 2012-04-21 LAB — BASIC METABOLIC PANEL
BUN: 13 mg/dL (ref 6–23)
CO2: 25 mEq/L (ref 19–32)
Calcium: 8.5 mg/dL (ref 8.4–10.5)
Chloride: 108 mEq/L (ref 96–112)
Creatinine, Ser: 0.8 mg/dL (ref 0.4–1.2)
GFR: 90.76 mL/min (ref >60.00)
Glucose, Bld: 96 mg/dL (ref 70–99)
Potassium: 4.2 mEq/L (ref 3.5–5.1)
Sodium: 139 mEq/L (ref 135–145)

## 2012-04-21 LAB — POCT URINALYSIS DIP (MANUAL ENTRY)
Bilirubin, UA: NEGATIVE
Glucose, UA: NEGATIVE
Ketones, POC UA: NEGATIVE
Leukocytes, UA: NEGATIVE
Nitrite, UA: POSITIVE
Protein Ur, POC: NEGATIVE
Spec Grav, UA: 1.015
Urobilinogen, UA: 0.2
pH, UA: 6.5

## 2012-04-21 LAB — POCT URINE PREGNANCY: Preg Test, Ur: NEGATIVE

## 2012-04-21 LAB — T4, FREE: Free T4: 0.8 ng/dL (ref 0.60–1.60)

## 2012-04-21 LAB — TSH: TSH: 1.32 u[IU]/mL (ref 0.35–5.50)

## 2012-04-21 LAB — MAGNESIUM: Magnesium: 2 mg/dL (ref 1.5–2.5)

## 2012-04-21 MED ORDER — PROMETHAZINE HCL 25 MG PO TABS: 25.0000 mg | ORAL_TABLET | Freq: Four times a day (QID) | ORAL | Status: DC | PRN

## 2012-04-21 NOTE — Progress Notes (Signed)
Chief Complaint  Patient presents with  . Dizziness    Started 2 weeks ago.  . Fatigue  . Nausea    HPI: Patient comes in today for SDA for  new problem evaluation. PCP NA  tis afternoon   . She's had the onset  Of sx 2 weeks ago but getting worse and had to leave work for her sx of nausea lightheadedness without vertigo extreme fatigue and lack of energy and extreme nausea vomited one time otherwise keeping fluids down and drinking a lot had one loose stool. No blood in her stool no associated fever possibly some chills feels like she's sleeping a lot. She has similar thing that happened to her last year and was actually at the hospital evaluated for this was found to have a low potassium and a low hemoglobin. There was no specific reason for the low potassium.  Since that time she's just been under treatment for a sore shoulder rotator cuff with history of cortisone shots but takes ibuprofen ' maximum 6 200 mg tablets a day or 1200 mg total. No history of stomach ulcer or bleeding.  She states she is supposed to get an MRI of her liver and is due for that because she had some spots on it it hasn't had a chance to get this yet.  She's had a tubal ligation and is having a period now otherwise doesn't think there is a risk of pregnancy. ROS: See pertinent positives and negatives per HPI. She has photosensitivity with a mild headache no visual disturbances and no focal weakness or numbness. Denies fainting.  Past Medical History  Diagnosis Date  . HYPOKALEMIA 10/17/2009  . LOW BLOOD PRESSURE 10/17/2009  . SINUSITIS, ACUTE 03/07/2009  . 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 UNSPEC DISORDERS BURSAE&TENDONS SHOULDER REGION 11/09/2008  . FATIGUE 05/04/2009  . NIGHT SWEATS 05/04/2009  . Polydipsia 05/04/2009  . SHINGLES, HX OF 11/24/2008  . Headache     Family History  Problem Relation Age of Onset  . Arthritis Other   . Hyperlipidemia Other   . Hypertension Other   . Cancer Other     lung  . Diabetes Other      History   Social History  . Marital Status: Married    Spouse Name: N/A    Number of Children: N/A  . Years of Education: N/A   Social History Main Topics  . Smoking status: Never Smoker   . Smokeless tobacco: None  . Alcohol Use: None  . Drug Use: None  . Sexually Active: None   Other Topics Concern  . None   Social History Narrative   Works for [employer] of 2 but mostly lives by herself    Outpatient Encounter Prescriptions as of 04/21/2012  Medication Sig Dispense Refill  . promethazine (PHENERGAN) 25 MG tablet Take 1 tablet (25 mg total) by mouth every 6 (six) hours as needed for nausea.  15 tablet  0  . [DISCONTINUED] promethazine (PHENERGAN) 25 MG suppository Place 1 suppository (25 mg total) rectally every 6 (six) hours as needed for nausea.  12 each  0  . [DISCONTINUED] triamcinolone (KENALOG) 0.1 % paste Place onto mouth ulcers twice daily  5 g  12    EXAM:  BP 114/72  Pulse 77  Temp 98.8 F (37.1 C) (Oral)  Wt 113 lb (51.256 kg)  SpO2 99%  LMP 04/18/2012  There is no height on file to calculate BMI. Orthostatic bps 108sitting 116 standing  Pulse 68- 76  GENERAL: vitals reviewed and listed above, alert, oriented, appears well hydrated a laying down in dark room prefers to lay down feels better laying however she is cognitively intact verbal. Able to sit up and walk slowly.  HEENT: Normocephalic ;atraumatic , Eyes;  PERRL, EOMs  Full, lids and conjunctiva clear,,Ears: no deformities, canals nl, TM landmarks normal, Nose: no deformity or discharge  Mouth : OP clear without lesion or edema .  NECK: no obvious masses on inspection palpation no bruits  LUNGS: clear to auscultation bilaterally, no wheezes, rales or rhonchi, good air movement Abdomen:  Sof,t normal bowel sounds without hepatosplenomegaly, no guarding rebound or masses no CVA tenderness Neurologic cranial nerves III through XII appear intact no clonus or fasciculations non-focal weakness  gait within normal limits CV: HRRR, no clubbing cyanosis or  peripheral edema nl cap refill  Skin normal turgor; normal color ;no bruising or bleeding MS: moves all extremities without noticeable focal  abnormality no acute swelling  PSYCH: Cognitively intact cooperative, no obvious depression or anxiety  ASSESSMENT AND PLAN:  Discussed the following assessment and plan:  1. Lightheaded  Basic metabolic panel, CBC with Differential, Hepatic function panel, TSH, T4, free, Magnesium, POCT urinalysis dipstick, POCT Pregnancy, Urine   hx of low k check bmp r/o metabolic anemia  2. Nausea  Basic metabolic panel, CBC with Differential, Hepatic function panel, TSH, T4, free, Magnesium, POCT urinalysis dipstick, POCT Pregnancy, Urine, POCT urine pregnancy  3. Fatigue  Basic metabolic panel, CBC with Differential, Hepatic function panel, TSH, T4, free, Magnesium, POCT urinalysis dipstick, POCT Pregnancy, Urine  4. Right shoulder pain     taking nsaids   overall uncertain what is going on she's had this for a couple weeks apparently has had hypokalemia in the past denies anything like eating disorder supplement use or things that could cause this problem. It is very prolonged to be from a stomach virus although she could have gastritis with daily use of anti-inflammatories. We'll check full set of laboratory tests today last ones done a year ago. Note for work to write her out and chilled 2 days from now my she's not feeling well.  Close followup with Dr. Caryl Never.   It appears that the MRI was supposed to have been done last year and has been overdue .  Would have PCP help arrange followup.  -Patient advised to return or notify health care team  immediately if symptoms worsen or persist or new concerns arise.  Patient Instructions  Uncertain cause of symptoms but we will check you for anemia and low potassium.  Can use Phenergan as needed but this can make you drowsy do not drive continue adequate  fluids.  Advise followup visit with Dr. Caryl Never next week or as needed.  Would limit the ibuprofen as best you can in case this is adding to the nausea.   Neta Mends. Marji Kuehnel M.D.

## 2012-04-21 NOTE — Patient Instructions (Addendum)
Uncertain cause of symptoms but we will check you for anemia and low potassium.  Can use Phenergan as needed but this can make you drowsy do not drive continue adequate fluids.  Advise followup visit with Dr. Caryl Never next week or as needed.  Would limit the ibuprofen as best you can in case this is adding to the nausea.

## 2012-04-23 NOTE — Progress Notes (Signed)
Quick Note:  Pt called back and was informed, she will make ROV for next week ______

## 2012-04-23 NOTE — Progress Notes (Signed)
Quick Note:  VM left for pt on personally identified phone that she needs return OV with Dr Caryl Never. I also tried to call pt work phone, it just rang and rang and rang, no answer, no VM. ______

## 2012-05-10 ENCOUNTER — Ambulatory Visit (INDEPENDENT_AMBULATORY_CARE_PROVIDER_SITE_OTHER): Payer: BC Managed Care – PPO | Admitting: Family Medicine

## 2012-05-10 ENCOUNTER — Encounter: Payer: Self-pay | Admitting: Family Medicine

## 2012-05-10 VITALS — BP 100/70 | Temp 97.8°F | Wt 110.0 lb

## 2012-05-10 DIAGNOSIS — R5381 Other malaise: Secondary | ICD-10-CM

## 2012-05-10 DIAGNOSIS — R82998 Other abnormal findings in urine: Secondary | ICD-10-CM

## 2012-05-10 DIAGNOSIS — R8281 Pyuria: Secondary | ICD-10-CM

## 2012-05-10 DIAGNOSIS — R5383 Other fatigue: Secondary | ICD-10-CM

## 2012-05-10 DIAGNOSIS — D649 Anemia, unspecified: Secondary | ICD-10-CM

## 2012-05-10 DIAGNOSIS — D509 Iron deficiency anemia, unspecified: Secondary | ICD-10-CM | POA: Insufficient documentation

## 2012-05-10 LAB — POCT URINALYSIS DIPSTICK
Bilirubin, UA: NEGATIVE
Glucose, UA: NEGATIVE
Ketones, UA: NEGATIVE
Nitrite, UA: POSITIVE
Spec Grav, UA: >=1.03
Urobilinogen, UA: 0.2
pH, UA: 6

## 2012-05-10 LAB — IRON AND TIBC
%SAT: 7 % — ABNORMAL LOW (ref 20–55)
Iron: 29 ug/dL — ABNORMAL LOW (ref 42–145)
TIBC: 407 ug/dL (ref 250–470)
UIBC: 378 ug/dL (ref 125–400)

## 2012-05-10 LAB — VITAMIN B12: Vitamin B-12: 337 pg/mL (ref 211–911)

## 2012-05-10 LAB — FERRITIN: Ferritin: 17.5 ng/mL (ref 10.0–291.0)

## 2012-05-10 MED ORDER — CEPHALEXIN 500 MG PO CAPS: 500.0000 mg | ORAL_CAPSULE | Freq: Three times a day (TID) | ORAL | Status: DC

## 2012-05-10 NOTE — Patient Instructions (Addendum)
Urinary Tract Infection Urinary tract infections (UTIs) can develop anywhere along your urinary tract. Your urinary tract is your body's drainage system for removing wastes and extra water. Your urinary tract includes two kidneys, two ureters, a bladder, and a urethra. Your kidneys are a pair of bean-shaped organs. Each kidney is about the size of your fist. They are located below your ribs, one on each side of your spine. CAUSES Infections are caused by microbes, which are microscopic organisms, including fungi, viruses, and bacteria. These organisms are so small that they can only be seen through a microscope. Bacteria are the microbes that most commonly cause UTIs. SYMPTOMS  Symptoms of UTIs may vary by age and gender of the patient and by the location of the infection. Symptoms in young women typically include a frequent and intense urge to urinate and a painful, burning feeling in the bladder or urethra during urination. Older women and men are more likely to be tired, shaky, and weak and have muscle aches and abdominal pain. A fever may mean the infection is in your kidneys. Other symptoms of a kidney infection include pain in your back or sides below the ribs, nausea, and vomiting. DIAGNOSIS To diagnose a UTI, your caregiver will ask you about your symptoms. Your caregiver also will ask to provide a urine sample. The urine sample will be tested for bacteria and white blood cells. White blood cells are made by your body to help fight infection. TREATMENT  Typically, UTIs can be treated with medication. Because most UTIs are caused by a bacterial infection, they usually can be treated with the use of antibiotics. The choice of antibiotic and length of treatment depend on your symptoms and the type of bacteria causing your infection. HOME CARE INSTRUCTIONS  If you were prescribed antibiotics, take them exactly as your caregiver instructs you. Finish the medication even if you feel better after you  have only taken some of the medication.  Drink enough water and fluids to keep your urine clear or pale yellow.  Avoid caffeine, tea, and carbonated beverages. They tend to irritate your bladder.  Empty your bladder often. Avoid holding urine for long periods of time.  Empty your bladder before and after sexual intercourse.  After a bowel movement, women should cleanse from front to back. Use each tissue only once. SEEK MEDICAL CARE IF:   You have back pain.  You develop a fever.  Your symptoms do not begin to resolve within 3 days. SEEK IMMEDIATE MEDICAL CARE IF:   You have severe back pain or lower abdominal pain.  You develop chills.  You have nausea or vomiting.  You have continued burning or discomfort with urination. MAKE SURE YOU:   Understand these instructions.  Will watch your condition.  Will get help right away if you are not doing well or get worse. Document Released: 01/08/2005 Document Revised: 09/30/2011 Document Reviewed: 05/09/2011 [employer] Patient Information 2013 [employer].  

## 2012-05-10 NOTE — Progress Notes (Addendum)
Subjective:    Patient ID: [name], female    DOB: 12/25/1970, 42 y.o.   MRN: [id number]  HPI Patient seen with a least one month history of some progressive fatigue, mild dizziness with standing, increased sleeping. She was seen a few weeks ago had several labs. Significant for hemoglobin 9.6 which is normocytic. Most her other labs were normal. She had thyroid functions are normal.  Menses are regular and normal. Prior history of bilateral tubal ligation.  She's had some increased irritability but denies any depression. Intermittent mild headaches. Occasional fleeting nausea but no vomiting. No stool changes. She's had prior history of anemia-not clear this is ever been fully worked up. No family history of colon disorder. Patient's had one day history of mild right mid quadrant abdominal pain otherwise no abdominal pain. She has taken ibuprofen frequently over the past year or 2 for some shoulder issues. Appetite and weight are stable.  Denies dysuria. No vaginal discharge.  Past Medical History  Diagnosis Date  . HYPOKALEMIA 10/17/2009  . LOW BLOOD PRESSURE 10/17/2009  . SINUSITIS, ACUTE 03/07/2009  . 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 UNSPEC DISORDERS BURSAE&TENDONS SHOULDER REGION 11/09/2008  . FATIGUE 05/04/2009  . NIGHT SWEATS 05/04/2009  . Polydipsia 05/04/2009  . SHINGLES, HX OF 11/24/2008  . Headache    Past Surgical History  Procedure Date  . Tubal ligation 2007  . External auditory canal reconstruction     tubes as a [place]    reports that she has never smoked. She does not have any smokeless tobacco history on file. Her alcohol and drug histories not on file. family history includes Arthritis in her other; Cancer in her other; Diabetes in her other; Hyperlipidemia in her other; and Hypertension in her other. No Known Allergies    Review of Systems  Constitutional: Positive for fatigue. Negative for fever, chills, appetite change and unexpected weight change.  HENT: Negative for sore  throat and trouble swallowing.   Respiratory: Negative for shortness of breath and wheezing.   Cardiovascular: Negative for chest pain, palpitations and leg swelling.  Gastrointestinal: Positive for abdominal pain. Negative for nausea, vomiting, diarrhea, constipation and blood in stool.  Genitourinary: Negative for dysuria and hematuria.  Neurological: Positive for dizziness and light-headedness. Negative for syncope.  Psychiatric/Behavioral: Negative for dysphoric mood.       Objective:   Physical Exam  Constitutional: She appears well-developed and well-nourished.  HENT:  Right Ear: External ear normal.  Left Ear: External ear normal.  Mouth/Throat: Oropharynx is clear and moist.  Neck: Neck supple. No thyromegaly present.  Cardiovascular: Normal rate and regular rhythm.   No murmur heard. Pulmonary/Chest: Effort normal and breath sounds normal. No respiratory distress. She has no wheezes. She has no rales.  Abdominal: Soft. Bowel sounds are normal. She exhibits no distension.       Minimal tenderness right mid quadrant to deep palpation. No guarding or rebound. No masses.  Musculoskeletal: She exhibits no edema.  Skin: No rash noted.  Psychiatric: She has a normal mood and affect. Her behavior is normal.          Assessment & Plan:  Patient presents with constellation of symptoms including lightheadedness, fatigue, irritability. She has normocytic anemia which apparently has been more chronic. Needs further evaluation. Check Hemoccults. Check further labs with ferritin, serum iron, TIBC, B12. No history of celiac disease in family and no history of Gluten intolerance but screen with tissue transglutaminase  Urine suggests possible UTI with leukocytes, blood, and  nitrites. Urine culture sent. Keflex 500 mg 3 times a day for 5 days pending culture results  Slightly low iron with normal TIBC.  Patient has relatively normal menses and has already been on iron supplement.   Complete hemoccults and I am recommending GI evaluation at this time.

## 2012-05-11 LAB — TISSUE TRANSGLUTAMINASE, IGA: Tissue Transglutaminase Ab, IgA: 10.4 U/mL (ref <20)

## 2012-05-11 NOTE — Addendum Note (Signed)
Addended by: Kristian Covey on: 05/11/2012 04:23 PM   Modules accepted: Orders

## 2012-05-12 NOTE — Progress Notes (Signed)
Quick Note:  Pt informed ______ 

## 2012-05-13 LAB — URINE CULTURE: Colony Count: >=100000

## 2012-05-13 NOTE — Progress Notes (Signed)
Quick Note:  Pt informed ______ 

## 2012-11-28 ENCOUNTER — Emergency Department (INDEPENDENT_AMBULATORY_CARE_PROVIDER_SITE_OTHER)
Admission: EM | Admit: 2012-11-28 | Discharge: 2012-11-28 | Disposition: A | Discharge status: Home / Self Care | Payer: BC Managed Care – PPO | Source: Home / Self Care

## 2012-11-28 ENCOUNTER — Encounter (HOSPITAL_COMMUNITY): Payer: Self-pay | Admitting: Emergency Medicine

## 2012-11-28 DIAGNOSIS — H5789 Other specified disorders of eye and adnexa: Secondary | ICD-10-CM

## 2012-11-28 NOTE — ED Provider Notes (Signed)
CSN: [id number]     Arrival date & time 11/28/12  1237 History     First MD Initiated Contact with Patient 11/28/12 1329     Chief Complaint  Patient presents with  . Facial Swelling   (Consider location/radiation/quality/duration/timing/severity/associated sxs/prior Treatment) HPI Comments: Swelling around eyes began last night. Pt denies any exposure to new substances, meds, lotions, foods, etc. Eyelids are swollen.  No change in vision, no pain, no itching. Took benadryl last night and this morning but hasn't noticed a difference.  Use cool compress once but hasn't noticed a difference.  Does not appear to be getting worse.    Past Medical History  Diagnosis Date  . HYPOKALEMIA 10/17/2009  . LOW BLOOD PRESSURE 10/17/2009  . SINUSITIS, ACUTE 03/07/2009  . 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 UNSPEC DISORDERS [place] SHOULDER REGION 11/09/2008  . FATIGUE 05/04/2009  . NIGHT SWEATS 05/04/2009  . Polydipsia 05/04/2009  . SHINGLES, HX OF 11/24/2008  . [id number](478.2)    Past Surgical History  Procedure Laterality Date  . Tubal ligation  2007  . External auditory canal reconstruction      tubes as a [place]   Family History  Problem Relation Age of Onset  . Arthritis Other   . Hyperlipidemia Other   . Hypertension Other   . Cancer Other     lung  . Diabetes Other    History  Substance Use Topics  . Smoking status: Never Smoker   . Smokeless tobacco: Not on file  . Alcohol Use: Not on file   OB History   Grav Para Term Preterm Abortions TAB SAB Ect Mult Living                 Review of Systems  Constitutional: Negative for fever and chills.  HENT: Negative for congestion and rhinorrhea.   Respiratory: Negative for shortness of breath and wheezing.   Skin:       Swelling around eyes, no itching    Allergies  Review of patient's allergies indicates no known allergies.  Home Medications   Current Outpatient Rx  Name  Route  Sig  Dispense  Refill  . cephALEXin (KEFLEX) 500 MG  capsule   Oral   Take 1 capsule (500 mg total) by mouth 3 (three) times daily.   15 capsule   0   . promethazine (PHENERGAN) 25 MG tablet   Oral   Take 1 tablet (25 mg total) by mouth every 6 (six) hours as needed for nausea.   15 tablet   0    BP 95/56  Pulse 82  Temp(Src) 98.5 F (36.9 C) (Oral)  Resp 18  SpO2 100%  LMP 10/23/2012 Physical Exam  Constitutional: She appears well-developed and well-nourished. No distress.  Eyes: Conjunctivae are normal. Right eye exhibits no discharge and no exudate. Left eye exhibits no discharge and no exudate.  Very slight swelling to upper eyelids, mild swelling to lower periorbital areas B  Skin: Skin is warm, dry and intact.    ED Course   Procedures (including critical care time)  Labs Reviewed - No data to display No results found. 1. Periorbital swelling     MDM  This appears to be most likely a kind of local allergic reaction. Encouraged continued round the clock use of benadryl if tolerated, use cool compresses. Pt's period is late, but pt reports they have been irregular lately and declines offer of pregnancy test as she had a tubal ligation in 2007.  Pt to monitor  for period and f/u with pcp if does not occur in next few days. Discussed risks assoc with pregnancy after tubal ligation.   Cathlyn Parsons, NP 11/28/12 1344

## 2012-11-28 NOTE — ED Notes (Signed)
C/o bilateral eye swelling which started last night.  Denies watery eyes, any discharge or mucous build up.  Patient states she has not used any new product.  This is the first time patient has experienced this problem.  Cold wash cloth treatment done.

## 2012-11-28 NOTE — ED Provider Notes (Signed)
Medical screening examination/treatment/procedure(s) were performed by a resident physician or non-physician practitioner and as the supervising physician I was immediately available for consultation/collaboration.  Clementeen Graham, MD   Rodolph Bong, MD 11/28/12 [phone]

## 2012-11-29 ENCOUNTER — Telehealth: Payer: Self-pay | Admitting: Family Medicine

## 2012-11-29 NOTE — Telephone Encounter (Signed)
[facility] Triage Call Report Triage Record Num: [id number] Operator: Baldomero Lamy Patient Name: [name] Call Date & Time: 11/28/2012 11:31:02AM Patient Phone: [phone] PCP: Evelena Peat Patient Gender: Female PCP Fax : [phone] Patient DOB: May 02, 1970 [facility] Name: [facility] Reason for Call: Caller: [name]/Patient; PCP: Evelena Peat (Family Practice); CB#: [phone]; Call regarding Swelling in right eye; Pt calling regarding swelling to both eyes bilaterally. Right eye is swollen above and below. Onset irritation, 11/27/12. Denies any drainage. Pt not sure what caused it. Trx 11/27/12 hs with 2-25 mg Benadryl. No relief. Afebrile. See Provider w/in 24 hrs for: New onset of eye redness, irritation/foreign body sensation or gritty feeling with watery or sticky mucus drainage per Eye: Infection or Irritation protocol. Advised pt to go to [facility] on [address] Pt to have another adult drive her now. Protocol(s) Used: Eye: Infection or Irritation Recommended Outcome per Protocol: See Provider within 24 hours Reason for Outcome: New onset of eye redness, irritation/foreign body sensation or gritty feeling with watery or sticky mucus drainage Care Advice: Call provider if symptoms get worse, or you develop increasing eye pain, changes in vision, or blisters or sores on eye or insides of eyelids. ~ ~ SYMPTOM / CONDITION MANAGEMENT ~ CAUTIONS ~ SEE PROVIDER WITHIN 4 HOURS if eyelid or area surrounding eye is red, warm, or tender. 08/

## 2014-12-13 ENCOUNTER — Ambulatory Visit (INDEPENDENT_AMBULATORY_CARE_PROVIDER_SITE_OTHER): Payer: Self-pay | Admitting: Family Medicine

## 2014-12-13 ENCOUNTER — Encounter: Payer: Self-pay | Admitting: Family Medicine

## 2014-12-13 VITALS — BP 78/50 | HR 67 | Temp 97.7°F | Wt 109.0 lb

## 2014-12-13 DIAGNOSIS — M546 Pain in thoracic spine: Secondary | ICD-10-CM

## 2014-12-13 DIAGNOSIS — M545 Low back pain, unspecified: Secondary | ICD-10-CM

## 2014-12-13 DIAGNOSIS — M542 Cervicalgia: Secondary | ICD-10-CM

## 2014-12-13 DIAGNOSIS — R51 Headache: Secondary | ICD-10-CM

## 2014-12-13 DIAGNOSIS — R519 Headache, unspecified: Secondary | ICD-10-CM

## 2014-12-13 DIAGNOSIS — M549 Dorsalgia, unspecified: Secondary | ICD-10-CM

## 2014-12-13 MED ORDER — METHOCARBAMOL 500 MG PO TABS: 500.0000 mg | ORAL_TABLET | Freq: Three times a day (TID) | ORAL | Status: DC | PRN

## 2014-12-13 MED ORDER — KETOROLAC TROMETHAMINE 60 MG/2ML IM SOLN
60.0000 mg | Freq: Once | INTRAMUSCULAR | Status: AC
  Administered 2014-12-13: 60 mg via INTRAMUSCULAR

## 2014-12-13 NOTE — Progress Notes (Signed)
Pre visit review using our clinic review tool, if applicable. No additional management support is needed unless otherwise documented below in the visit note. 
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
## 2015-05-04 ENCOUNTER — Ambulatory Visit (INDEPENDENT_AMBULATORY_CARE_PROVIDER_SITE_OTHER): Payer: BLUE CROSS/BLUE SHIELD | Admitting: Family Medicine

## 2015-05-04 ENCOUNTER — Encounter: Payer: Self-pay | Admitting: Family Medicine

## 2015-05-04 VITALS — BP 100/70 | HR 68 | Temp 97.8°F | Ht 66.0 in | Wt 113.0 lb

## 2015-05-04 DIAGNOSIS — R059 Cough, unspecified: Secondary | ICD-10-CM

## 2015-05-04 DIAGNOSIS — R05 Cough: Secondary | ICD-10-CM | POA: Diagnosis not present

## 2015-05-04 MED ORDER — AZITHROMYCIN 250 MG PO TABS
ORAL_TABLET | ORAL | Status: DC
Start: 2015-05-04 — End: 2015-06-22

## 2015-05-04 MED ORDER — HYDROCOD POLST-CPM POLST ER 10-8 MG/5ML PO SUER: 5.0000 mL | Freq: Two times a day (BID) | ORAL | Status: DC | PRN

## 2015-05-04 NOTE — Progress Notes (Signed)
   Subjective:    Patient ID: [name], female    DOB: 02/11/1971, 45 y.o.   MRN: [id number]  HPI  Acute visit for cough and congestion. Onset a little over week ago. Occasionally productive cough but mostly dry. Interfering with sleep at night. She has some nasal congestion. No sore throat. Nonsmoker. Not aware of any wheezing. No dyspnea. She has some chest wall soreness related to severe coughing. She's tried Vicks vapor rub, cough drops, TheraFlu, NyQuil and none of those have relieved her cough. Cough has been severe at times.  Past Medical History  Diagnosis Date  . HYPOKALEMIA 10/17/2009  . LOW BLOOD PRESSURE 10/17/2009  . SINUSITIS, ACUTE 03/07/2009  . 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 [place] SHOULDER REGION 11/09/2008  . FATIGUE 05/04/2009  . NIGHT SWEATS 05/04/2009  . Polydipsia 05/04/2009  . SHINGLES, HX OF 11/24/2008  . [id number])    Past Surgical History  Procedure Laterality Date  . Tubal ligation  2007  . External auditory canal reconstruction      tubes as a [place]    reports that she has never smoked. She does not have any smokeless tobacco history on file. Her alcohol and drug histories are not on file. family history includes Arthritis in her other; Cancer in her other; Diabetes in her other; Hyperlipidemia in her other; Hypertension in her other. No Known Allergies   Review of Systems  Constitutional: Negative for fever and chills.  HENT: Positive for congestion.   Respiratory: Positive for cough. Negative for shortness of breath and wheezing.        Objective:   Physical Exam  Constitutional: She appears well-developed and well-nourished.  HENT:  Right Ear: External ear normal.  Left Ear: External ear normal.  Mouth/Throat: Oropharynx is clear and moist.  Neck: Neck supple.  Cardiovascular: Normal rate and regular rhythm.   No murmur heard. Pulmonary/Chest: Effort normal and breath sounds normal. No respiratory distress. She has no  wheezes. She has no rales.  Lymphadenopathy:    She has no cervical adenopathy.          Assessment & Plan:  Cough. Suspect acute viral bronchitis. Tussionex 1 teaspoon daily at bedtime for severe cough. We've recommended that she observe for now otherwise but she insisted on antibiotic. We wrote for (Zithromax) to start if she develops any fever or worsening symptoms

## 2015-05-04 NOTE — Patient Instructions (Signed)

## 2015-05-04 NOTE — Progress Notes (Signed)
Pre visit review using our clinic review tool, if applicable. No additional management support is needed unless otherwise documented below in the visit note. 

## 2015-06-22 ENCOUNTER — Ambulatory Visit (INDEPENDENT_AMBULATORY_CARE_PROVIDER_SITE_OTHER): Payer: BLUE CROSS/BLUE SHIELD | Admitting: Family Medicine

## 2015-06-22 DIAGNOSIS — D649 Anemia, unspecified: Secondary | ICD-10-CM

## 2015-06-22 DIAGNOSIS — R1011 Right upper quadrant pain: Secondary | ICD-10-CM | POA: Diagnosis not present

## 2015-06-22 DIAGNOSIS — R42 Dizziness and giddiness: Secondary | ICD-10-CM | POA: Diagnosis not present

## 2015-06-22 DIAGNOSIS — Z862 Personal history of diseases of the blood and blood-forming organs and certain disorders involving the immune mechanism: Secondary | ICD-10-CM

## 2015-06-22 DIAGNOSIS — R5383 Other fatigue: Secondary | ICD-10-CM | POA: Diagnosis not present

## 2015-06-22 DIAGNOSIS — R197 Diarrhea, unspecified: Secondary | ICD-10-CM

## 2015-06-22 LAB — BASIC METABOLIC PANEL
BUN: 13 mg/dL (ref 6–23)
CO2: 26 mEq/L (ref 19–32)
Calcium: 8.8 mg/dL (ref 8.4–10.5)
Chloride: 104 mEq/L (ref 96–112)
Creatinine, Ser: 0.67 mg/dL (ref 0.40–1.20)
GFR: 101.81 mL/min (ref >60.00)
Glucose, Bld: 93 mg/dL (ref 70–99)
Potassium: 4.1 mEq/L (ref 3.5–5.1)
Sodium: 139 mEq/L (ref 135–145)

## 2015-06-22 LAB — CBC WITH DIFFERENTIAL/PLATELET
Basophils Absolute: 0 10*3/uL (ref 0.0–0.1)
Basophils Relative: 0.5 % (ref 0.0–3.0)
Eosinophils Absolute: 0.1 10*3/uL (ref 0.0–0.7)
Eosinophils Relative: 1.1 % (ref 0.0–5.0)
HCT: 31.2 % — ABNORMAL LOW (ref 36.0–46.0)
Hemoglobin: 10.3 g/dL — ABNORMAL LOW (ref 12.0–15.0)
Lymphocytes Relative: 30 % (ref 12.0–46.0)
Lymphs Abs: 2.4 10*3/uL (ref 0.7–4.0)
MCHC: 33 g/dL (ref 30.0–36.0)
MCV: 85 fl (ref 78.0–100.0)
Monocytes Absolute: 0.6 10*3/uL (ref 0.1–1.0)
Monocytes Relative: 7.8 % (ref 3.0–12.0)
Neutro Abs: 4.8 10*3/uL (ref 1.4–7.7)
Neutrophils Relative %: 60.6 % (ref 43.0–77.0)
Platelets: 306 10*3/uL (ref 150.0–400.0)
RBC: 3.67 Mil/uL — ABNORMAL LOW (ref 3.87–5.11)
RDW: 19 % — ABNORMAL HIGH (ref 11.5–15.5)
WBC: 8 10*3/uL (ref 4.0–10.5)

## 2015-06-22 LAB — HEPATIC FUNCTION PANEL
ALT: 11 U/L (ref 0–35)
AST: 14 U/L (ref 0–37)
Albumin: 4.2 g/dL (ref 3.5–5.2)
Alkaline Phosphatase: 69 U/L (ref 39–117)
Bilirubin, Direct: 0.1 mg/dL (ref 0.0–0.3)
Total Bilirubin: 0.4 mg/dL (ref 0.2–1.2)
Total Protein: 6.9 g/dL (ref 6.0–8.3)

## 2015-06-22 LAB — TSH: TSH: 1.44 u[IU]/mL (ref 0.35–4.50)

## 2015-06-22 LAB — FERRITIN: Ferritin: 5.1 ng/mL — ABNORMAL LOW (ref 10.0–291.0)

## 2015-06-22 NOTE — Patient Instructions (Signed)

## 2015-06-22 NOTE — Progress Notes (Signed)
Subjective:    Patient ID: [name], female    DOB: 1970/12/21, 45 y.o.   MRN: [id number]  HPI  Patient is here today with multiple complaints.  She has chronic problems including history of chronic intermittent dizziness, fatigue, history of anemia. She has history of iron deficiency.  Has been intolerant of oral iron in the past. Previous tissue transglutaminase antibodies negative. No known gluten sensitivity.   Multiple complaints today including fatigue, frequent lightheadedness, postprandial diarrhea, and right upper quadrant abdominal pain. No history of known lactose intolerance or gluten sensitivity.   Right upper quadrant abdominal pain. Had episodes over the past couple months about every 2-3 days. Dull pain with occasional radiation toward the right shoulder. No appetite or weight changes. No fevers or chills. No vomiting. No stool changes. Denies melena. Not related to specific foods.   Diarrhea. Patient relates chronic non-bloody postprandial diarrhea. Not related any specific foods. States that generally about 20 minutes after eating she has loose to watery stools. Occasional lower abdominal cramping. Has never had colonoscopy.   Past history of anemia. She has regular menses which she states are fairly heavy. She is followed by gynecology. Previous ferritin levels were normal but she had slightly low iron saturation.   Nonspecific fatigue. She's had this for years. Generally gets about 7-8 hour sleep at night. Denies depression.  Currently takes no medications.  Past Medical History  Diagnosis Date  . HYPOKALEMIA 10/17/2009  . LOW BLOOD PRESSURE 10/17/2009  . SINUSITIS, ACUTE 03/07/2009  . 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 [place] SHOULDER REGION 11/09/2008  . FATIGUE 05/04/2009  . NIGHT SWEATS 05/04/2009  . Polydipsia 05/04/2009  . SHINGLES, HX OF 11/24/2008  . [id number])    Past Surgical History  Procedure Laterality Date  . Tubal ligation  2007  .  External auditory canal reconstruction      tubes as a [place]    reports that she has never smoked. She does not have any smokeless tobacco history on file. Her alcohol and drug histories are not on file. family history includes Arthritis in her other; Cancer in her other; Diabetes in her other; Hyperlipidemia in her other; Hypertension in her other. No Known Allergies     Review of Systems  Constitutional: Positive for fatigue. Negative for fever, chills and unexpected weight change.  HENT: Negative for sore throat.   Respiratory: Negative for cough and shortness of breath.   Cardiovascular: Negative for chest pain and palpitations.  Gastrointestinal: Positive for abdominal pain and diarrhea. Negative for nausea, vomiting, constipation, blood in stool and rectal pain.  Genitourinary: Negative for dysuria.  Skin: Negative for rash.  Neurological: Positive for dizziness and light-headedness. Negative for tremors, syncope and weakness.  Hematological: Negative for adenopathy.       Objective:   Physical Exam  Constitutional: She is oriented to person, place, and time. She appears well-developed and well-nourished.  HENT:  Mouth/Throat: Oropharynx is clear and moist.  Neck: Neck supple.  Cardiovascular: Normal rate and regular rhythm.  Exam reveals no gallop.   No murmur heard. Pulmonary/Chest: Effort normal and breath sounds normal. No respiratory distress. She has no wheezes. She has no rales.  Abdominal: Soft. Bowel sounds are normal. She exhibits no distension and no mass. There is tenderness. There is no rebound and no guarding.  Minimally tender right upper quadrant to deep palpation. No masses palpated. No hepatomegaly  Musculoskeletal: She exhibits no edema.  Lymphadenopathy:    She has no cervical  adenopathy.  Neurological: She is alert and oriented to person, place, and time.  Skin: No rash noted.          Assessment & Plan:   #1 intermittent abdominal pain right  upper quadrant. Check lab work and schedule ultrasound to further assess.   #2 history of iron deficiency anemia. Probably related to menstrual loss. She's been intolerant of oral iron preparations in the past. Recheck labs including CBC, ferritin, TIBC and iron. She is followed by GYN.   #3 nonspecific fatigue. Question related to #2. Check further labs including chemistries and TSH.   #4 frequent postprandial diarrhea. She's not had any red flag symptoms such as appetite or weight changes or any fever or bloody stools. This has been more or less chronic. No history of lactose intolerance. Check celiac antibody panel. Consider GI referral if symptoms persist and above unrevealing

## 2015-06-22 NOTE — Progress Notes (Signed)
Pre visit review using our clinic review tool, if applicable. No additional management support is needed unless otherwise documented below in the visit note. 

## 2015-06-23 LAB — IRON AND TIBC
%SAT: 6 % — ABNORMAL LOW (ref 11–50)
Iron: 21 ug/dL — ABNORMAL LOW (ref 40–190)
TIBC: 381 ug/dL (ref 250–450)
UIBC: 360 ug/dL (ref 125–400)

## 2015-06-25 LAB — SEDIMENTATION RATE: Sed Rate: 27 mm/hr — ABNORMAL HIGH (ref 0–22)

## 2015-06-26 ENCOUNTER — Telehealth: Payer: Self-pay | Admitting: Family Medicine

## 2015-06-26 LAB — CELIAC PANEL 10
Endomysial Screen: NEGATIVE
Gliadin IgA: 6 Units (ref <20)
Gliadin IgG: 4 Units (ref <20)
IgA: 617 mg/dL — ABNORMAL HIGH (ref 69–380)
Tissue Transglut Ab: 1 U/mL (ref <6)
Tissue Transglutaminase Ab, IgA: 1 U/mL (ref <4)

## 2015-06-26 NOTE — Telephone Encounter (Signed)
Pt is returning [name]'s message concerning lab results. Pt does not want to wait until tomorrow for [name] to return.  Pt asked if I could find someone else to call her with results. Do you mind giving pt a call?

## 2015-06-26 NOTE — Telephone Encounter (Signed)
Patient notified of lab results. Patient verbalized understanding.    

## 2015-06-26 NOTE — Addendum Note (Signed)
Addended by: Elio Forget on: 06/26/2015 01:19 PM   Modules accepted: Orders

## 2015-07-05 ENCOUNTER — Ambulatory Visit
Admission: RE | Admit: 2015-07-05 | Discharge: 2015-07-05 | Disposition: A | Discharge status: Home / Self Care | Payer: BLUE CROSS/BLUE SHIELD | Source: Ambulatory Visit | Attending: Family Medicine | Admitting: Family Medicine

## 2015-07-05 DIAGNOSIS — R1011 Right upper quadrant pain: Secondary | ICD-10-CM

## 2015-07-06 ENCOUNTER — Other Ambulatory Visit: Payer: Self-pay | Admitting: Family Medicine

## 2015-07-06 DIAGNOSIS — R197 Diarrhea, unspecified: Secondary | ICD-10-CM

## 2015-07-06 DIAGNOSIS — R1011 Right upper quadrant pain: Secondary | ICD-10-CM

## 2015-07-26 ENCOUNTER — Telehealth: Payer: Self-pay | Admitting: Family Medicine

## 2015-07-26 NOTE — Telephone Encounter (Signed)
Will determine follow up after labs on Monday.

## 2015-07-26 NOTE — Telephone Encounter (Signed)
Pt would like Dr Elease Hashimoto to know that she is still not feeling well, has no energy. Pt will have the  labs Dr ordered on Monday. Pt states her BP was running lower than normal yesterday. wants Dr Elease Hashimoto to be aware in case he would like to see her after the labs on Monday.

## 2015-07-30 ENCOUNTER — Other Ambulatory Visit: Payer: BLUE CROSS/BLUE SHIELD

## 2015-08-02 NOTE — Telephone Encounter (Signed)
Tried calling patient back to reschedule with NA. Left detailed message for pt to call back and reschedule.

## 2015-08-02 NOTE — Telephone Encounter (Signed)
FYI: pt never showed for her lab appt

## 2015-08-02 NOTE — Telephone Encounter (Signed)
I would just make sure we call her at least once and document that she declines labs.

## 2015-08-29 ENCOUNTER — Ambulatory Visit (INDEPENDENT_AMBULATORY_CARE_PROVIDER_SITE_OTHER): Payer: BLUE CROSS/BLUE SHIELD | Admitting: Family Medicine

## 2015-08-29 VITALS — BP 100/70 | HR 88 | Temp 98.6°F | Ht 66.0 in | Wt 108.0 lb

## 2015-08-29 DIAGNOSIS — R5383 Other fatigue: Secondary | ICD-10-CM | POA: Diagnosis not present

## 2015-08-29 DIAGNOSIS — D509 Iron deficiency anemia, unspecified: Secondary | ICD-10-CM

## 2015-08-29 DIAGNOSIS — R197 Diarrhea, unspecified: Secondary | ICD-10-CM

## 2015-08-29 DIAGNOSIS — R634 Abnormal weight loss: Secondary | ICD-10-CM

## 2015-08-29 NOTE — Progress Notes (Signed)
   Subjective:    Patient ID: [name], female    DOB: [birth year], 45 y.o.   MRN: [id number]  HPI Patient seen with somewhat chronic symptoms of fatigue. She also complains of some nonspecific chills without documented fever and occasional night sweats. She has long-standing history of anemia. Recent hemoglobin 10.3 with ferritin 5.1. We treated her with slow Fe but she did not tolerate.  Her appetite has been somewhat diminished recently and she has had some mild weight loss. Her weight was 113 pounds in January and currently 108 pounds.  She's had a long history going back many years of tendencies toward diarrhea and frequent loose stools. Never has constipation. She has frequent diffuse abdominal cramps and suspected IBS. She denies any known history of lactose intolerance. Previous celiac antibody testing negative. She has never noted any bloody stools. No family history of colon problems  Recent chemistries including thyroid function negative. No recent antibiotics or travels.  Past Medical History  Diagnosis Date  . HYPOKALEMIA 10/17/2009  . LOW BLOOD PRESSURE 10/17/2009  . SINUSITIS, ACUTE 03/07/2009  . 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 [place] SHOULDER REGION 11/09/2008  . FATIGUE 05/04/2009  . NIGHT SWEATS 05/04/2009  . Polydipsia 05/04/2009  . SHINGLES, HX OF 11/24/2008  . [id number])    Past Surgical History  Procedure Laterality Date  . Tubal ligation  2007  . External auditory canal reconstruction      tubes as a [place]    reports that she has never smoked. She does not have any smokeless tobacco history on file. Her alcohol and drug histories are not on file. family history includes Arthritis in her other; Cancer in her other; Diabetes in her other; Hyperlipidemia in her other; Hypertension in her other. No Known Allergies     Review of Systems  Constitutional: Positive for chills, appetite change, fatigue and unexpected weight change. Negative for fever.    Respiratory: Negative for shortness of breath.   Cardiovascular: Negative for chest pain, palpitations and leg swelling.  Gastrointestinal: Positive for abdominal pain and diarrhea. Negative for nausea and vomiting.  Endocrine: Negative for polydipsia and polyuria.  Genitourinary: Negative for dysuria.       Objective:   Physical Exam  Constitutional: She appears well-developed and well-nourished.  Neck: Neck supple. No thyromegaly present.  Cardiovascular: Normal rate and regular rhythm.   Pulmonary/Chest: Effort normal and breath sounds normal. No respiratory distress. She has no wheezes. She has no rales.  Abdominal: Soft. Bowel sounds are normal. She exhibits no distension and no mass. There is no tenderness. There is no rebound and no guarding.          Assessment & Plan:  Patient presents with constellation of symptoms including fatigue, nonspecific chills without fever, frequent/chronic loose stools and chronic anemia. She has regular menses.   Has been intolerant of iron in the past. She had managed to take a few weeks of iron replacement recently and we had ordered follow-up CBC and ferritin. She will return for these labs. Recommend trial of lactose-free diet. Set up GI referral Her GI symptoms have gone on for several years and question IBS but with iron deficiency anemia and recent appetite changes feel that she needs further evaluation  Eulas Post MD [facility]

## 2015-08-29 NOTE — Patient Instructions (Signed)
Lactose-Free Diet, Adult If you have lactose intolerance, you are not able to digest lactose. Lactose is a natural sugar found mainly in milk and milk products. You may need to avoid all foods and beverages that contain lactose. A lactose-free diet can help you do this.  WHAT DO I NEED TO KNOW ABOUT THIS DIET?  Do not consume foods, beverages, vitamins, minerals, or medicines with lactose. Read ingredients lists carefully.  Look for the words "lactose-free" on labels.  Use lactase enzyme drops or tablets as directed by your health care provider.  Use lactose-free milk or a milk alternative, such as soy milk, for drinking and cooking.  Make sure you get enough calcium and vitamin D in your diet. A lactose-free eating plan can be lacking in these important nutrients.  Take calcium and vitamin D supplements as directed by your health care provider. Talk to your provider about supplements if you are not able to get enough calcium and vitamin D from food. WHICH FOODS HAVE LACTOSE? Lactose is found in:   Milk and foods made from milk.  Yogurt.   Cheese.  Butter.   Margarine.   Sour cream.   Cream.   Whipped toppings and nondairy creamers.  Ice cream and other milk-based desserts. Lactose is also found in foods or products made with milk or milk ingredients. To find out whether a food contains milk or a milk ingredient, look at the ingredients list. Avoid foods with the statement "May contain milk" and foods that contain:   Butter.   Cream.  Milk.  Milk solids.  Milk powder.   Whey.  Curd.  Caseinate.  Lactose.  Lactalbumin.  Lactoglobulin. WHAT ARE SOME ALTERNATIVES TO MILK AND FOODS MADE WITH MILK PRODUCTS?  Lactose-free milk.  Soy milk with added calcium and vitamin D.  Almond, coconut, or rice milk with added calcium and vitamin D. Note that these are low in protein.   Soy products, such as soy yogurt, soy cheese, soy ice cream, and  soy-based sour cream. WHICH FOODS CAN I EAT? Grains Breads and rolls made without milk, such as [place], [place], or [place] bread, bagels, pita, and [employer]. Corn tortillas, corn meal, grits, and polenta. Crackers without lactose or milk solids, such as soda crackers and graham crackers. Cooked or dry cereals without lactose or milk solids. Pasta, quinoa, couscous, barley, oats, bulgur, farro, rice, wild rice, or other grains prepared without milk or lactose. Plain popcorn.  Vegetables Fresh, frozen, and canned vegetables without cheese, cream, or butter sauces. Fruits All fresh, canned, frozen, or dried fruits that are not processed with lactose. Meats and Other Protein Sources Plain beef, chicken, fish, [place], lamb, veal, pork, wild game, or ham. Kosher-prepared meat products. Strained or junior meats that do not contain milk. Eggs. Soy meat substitutes. Beans, lentils, and hummus. Tofu. Nuts and seeds. Peanut or other nut butters without lactose. Soups, casseroles, and mixed dishes without cheese, cream, or milk.  Dairy Lactose-free milk. Soy, rice, or almond milk with added calcium and vitamin D. Soy cheese and yogurt. Beverages Carbonated drinks. Tea. Coffee, freeze-dried coffee, and some instant coffees. Fruit and vegetable juices.  Condiments Soy sauce. Carob powder. Olives. Gravy made with water. Baker's cocoa. Angie Fava. Pure seasonings and spices. Ketchup. Mustard. Bouillon. Broth.  Sweets and Desserts Water and fruit ices. Gelatin. Cookies, pies, or cakes made from allowed ingredients, such as angel food cake. Pudding made with water or a milk substitute. Lactose-free tofu desserts. Soy, coconut milk, or rice-milk-based frozen desserts.  Sugar. Honey. Jam, jelly, and marmalade. Molasses. Pure sugar candy. Dark chocolate without milk. Marshmallows.  Fats and Oils Margarines and salad dressings that do not contain milk. Berniece Salines. Vegetable oils. Shortening. Mayonnaise. Soy or coconut-based  cream.  The items listed above may not be a complete list of recommended foods or beverages. Contact your dietitian for more options.  WHICH FOODS ARE NOT RECOMMENDED? Grains Breads and rolls that contain milk. Toaster pastries. Muffins, biscuits, waffles, cornbread, and pancakes. These can be prepared at home, commercial, or from mixes. Sweet rolls, donuts, English muffins, fry bread, lefse, flour tortillas with lactose, or [place] toast made with milk or milk ingredients. Crackers that contain lactose. Corn curls. Cooked or dry cereals with lactose. Vegetables Creamed or breaded vegetables. Vegetables in a cheese or butter sauce or with lactose-containing margarines. Instant potatoes. [place] fries. Scalloped or au gratin potatoes.  Fruits None.  Meats and Other Protein Sources Scrambled eggs, omelets, and souffles that contain milk. Creamed or breaded meat, fish, chicken, or [place]. Sausage products, such as wieners and liver sausage. Cold cuts that contain milk solids. Cheese, cottage cheese, ricotta cheese, and cheese spreads. Lasagna and macaroni and cheese. Pizza. Peanut or other nut butters with added milk solids. Casseroles or mixed dishes containing milk or cheese.  Dairy All dairy products, including milk, goat's milk, buttermilk, kefir, acidophilus milk, flavored milk, evaporated milk, condensed milk, dulce de [place], eggnog, yogurt, cheese, and cheese spreads.  Beverages Hot chocolate. Cocoa with lactose. Instant iced teas. Powdered fruit drinks. Smoothies made with milk or yogurt.  Condiments Chewing gum that has lactose. Cocoa that has lactose. Spice blends if they contain milk products. Artificial sweeteners that contain lactose. Nondairy creamers.  Sweets and Desserts Ice cream, ice milk, gelato, sherbet, and frozen yogurt. Custard, pudding, and mousse. Cake, cream pies, cookies, and other desserts containing milk, cream, cream cheese, or milk chocolate. Pie crust made with  milk-containing margarine or butter. Reduced-calorie desserts made with a sugar substitute that contains lactose. Toffee and butterscotch. Milk, white, or dark chocolate that contains milk. Fudge. Caramel.  Fats and Oils Margarines and salad dressings that contain milk or cheese. Cream. Half and half. Cream cheese. Sour cream. Chip dips made with sour cream or yogurt.  The items listed above may not be a complete list of foods and beverages to avoid. Contact your dietitian for more information. AM I GETTING ENOUGH CALCIUM? Calcium is found in many foods that contain lactose and is important for bone health. The amount of calcium you need depends on your age:   Adults younger than 50 years: 1000 mg of calcium a day.  Adults older than 50 years: 1200 mg of calcium a day. If you are not getting enough calcium, other calcium sources include:   Orange juice with calcium added. There are 300-350 mg of calcium in 1 cup of orange juice.   Sardines with edible bones. There are 325 mg of calcium in 3 oz of sardines.   Calcium-fortified soy milk. There are 300-400 mg of calcium in 1 cup of calcium-fortified soy milk.  Calcium-fortified rice or almond milk. There are 300 mg of calcium in 1 cup of calcium-fortified rice or almond milk.  Canned salmon with edible bones. There are 180 mg of calcium in 3 oz of canned salmon with edible bones.   Calcium-fortified breakfast cereals. There are [id number] mg of calcium in calcium-fortified breakfast cereals.   Tofu set with calcium sulfate. There are 250 mg of calcium in  cup  of tofu set with calcium sulfate.  Spinach, cooked. There are 145 mg of calcium in  cup of cooked spinach.  Edamame, cooked. There are 130 mg of calcium in  cup of cooked edamame.  Collard greens, cooked. There are 125 mg of calcium in  cup of cooked collard greens.  Kale, frozen or cooked. There are 90 mg of calcium in  cup of cooked or frozen kale.   Almonds. There are  95 mg of calcium in  cup of almonds.  Broccoli, cooked. There are 60 mg of calcium in 1 cup of cooked broccoli.   This information is not intended to replace advice given to you by your health care provider. Make sure you discuss any questions you have with your health care provider.   Document Released: 09/20/2001 Document Revised: 08/15/2014 Document Reviewed: 07/01/2013 Elsevier Interactive Patient Education 2016 [employer].  Return for CBC and ferritin Try OTC chewable vitamin as discussed. We will set up GI referral.

## 2015-08-29 NOTE — Progress Notes (Signed)
Pre visit review using our clinic review tool, if applicable. No additional management support is needed unless otherwise documented below in the visit note. 

## 2015-08-30 ENCOUNTER — Other Ambulatory Visit (INDEPENDENT_AMBULATORY_CARE_PROVIDER_SITE_OTHER): Payer: BLUE CROSS/BLUE SHIELD

## 2015-08-30 DIAGNOSIS — D649 Anemia, unspecified: Secondary | ICD-10-CM

## 2015-08-30 LAB — CBC WITH DIFFERENTIAL/PLATELET
Basophils Absolute: 0 10*3/uL (ref 0.0–0.1)
Basophils Relative: 0.8 % (ref 0.0–3.0)
Eosinophils Absolute: 0.1 10*3/uL (ref 0.0–0.7)
Eosinophils Relative: 1.2 % (ref 0.0–5.0)
HCT: 36.8 % (ref 36.0–46.0)
Hemoglobin: 12.1 g/dL (ref 12.0–15.0)
Lymphocytes Relative: 33 % (ref 12.0–46.0)
Lymphs Abs: 1.7 10*3/uL (ref 0.7–4.0)
MCHC: 32.9 g/dL (ref 30.0–36.0)
MCV: 88.8 fl (ref 78.0–100.0)
Monocytes Absolute: 0.4 10*3/uL (ref 0.1–1.0)
Monocytes Relative: 6.8 % (ref 3.0–12.0)
Neutro Abs: 3.1 10*3/uL (ref 1.4–7.7)
Neutrophils Relative %: 58.2 % (ref 43.0–77.0)
Platelets: 227 10*3/uL (ref 150.0–400.0)
RBC: 4.14 Mil/uL (ref 3.87–5.11)
RDW: 20.1 % — ABNORMAL HIGH (ref 11.5–15.5)
WBC: 5.3 10*3/uL (ref 4.0–10.5)

## 2015-08-30 LAB — FERRITIN: Ferritin: 7 ng/mL — ABNORMAL LOW (ref 10.0–291.0)

## 2015-09-14 ENCOUNTER — Encounter: Payer: Self-pay | Admitting: Internal Medicine

## 2015-09-27 ENCOUNTER — Other Ambulatory Visit: Payer: Self-pay | Admitting: Gastroenterology

## 2015-09-27 DIAGNOSIS — R110 Nausea: Secondary | ICD-10-CM

## 2015-09-27 DIAGNOSIS — R1011 Right upper quadrant pain: Secondary | ICD-10-CM

## 2015-10-08 ENCOUNTER — Ambulatory Visit (HOSPITAL_COMMUNITY): Payer: BLUE CROSS/BLUE SHIELD

## 2015-11-07 ENCOUNTER — Ambulatory Visit (INDEPENDENT_AMBULATORY_CARE_PROVIDER_SITE_OTHER): Payer: BLUE CROSS/BLUE SHIELD | Admitting: Family Medicine

## 2015-11-07 VITALS — BP 80/60 | HR 97 | Temp 99.1°F | Ht 66.0 in | Wt 103.3 lb

## 2015-11-07 DIAGNOSIS — R5382 Chronic fatigue, unspecified: Secondary | ICD-10-CM | POA: Diagnosis not present

## 2015-11-07 DIAGNOSIS — I959 Hypotension, unspecified: Secondary | ICD-10-CM

## 2015-11-07 DIAGNOSIS — D649 Anemia, unspecified: Secondary | ICD-10-CM | POA: Diagnosis not present

## 2015-11-07 DIAGNOSIS — R42 Dizziness and giddiness: Secondary | ICD-10-CM

## 2015-11-07 DIAGNOSIS — R634 Abnormal weight loss: Secondary | ICD-10-CM

## 2015-11-07 NOTE — Progress Notes (Signed)
Pre visit review using our clinic review tool, if applicable. No additional management support is needed unless otherwise documented below in the visit note. 

## 2015-11-07 NOTE — Patient Instructions (Signed)

## 2015-11-07 NOTE — Progress Notes (Signed)
Subjective:     Patient ID: [name], female   DOB: 12-09-1970, 45 y.o.   MRN: [id number]  HPI Patient seen with persistent symptoms of fatigue, lightheadedness, and chronic loose to diarrhea stools. She's had fairly extensive recent workup. We obtained multiple labs including basic chemistries which were all normal. TSH normal. Ultrasound unremarkable. Celiac antibodies normal.  Patient was referred to GI. She had both upper endoscopy and colonoscopy along with small bowel biopsy July 7. Small bowel biopsy no evidence for celiac disease. She has follow-up with GI August ninth.  Patient states she is eating regularly but she's had some continued weight loss. Her weight is down 5 more pounds from May. She has nonspecific lightheadedness when she stands but symptoms are somewhat inconsistent. She usually has about 2-3 watery to loose stools per day. No bloody diarrhea. Frequent cold intolerance. Recent TSH normal. No history of bulimia. She very much wishes to gain weight so she has no evidence for anorexia nervosa  She has history of chronic iron deficiency. No heavy menses. Recent ferritin level low but her hemoglobin has been improving. She has been able to tolerate over-the-counter iron one daily. She is frustrated with inability to gain weight. Recent albumin was over 4 which indicates that she does not have any major malabsorption issues.  Past Medical History:  Diagnosis Date  . 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 FATIGUE 05/04/2009  . Headache(784.0)   . HYPOKALEMIA 10/17/2009  . LOW BLOOD PRESSURE 10/17/2009  . NIGHT SWEATS 05/04/2009  . Polydipsia 05/04/2009  . SHINGLES, HX OF 11/24/2008  . SINUSITIS, ACUTE 03/07/2009  . [place] SHOULDER REGION 11/09/2008   Past Surgical History:  Procedure Laterality Date  . EXTERNAL AUDITORY CANAL RECONSTRUCTION     tubes as a [place]  . TUBAL LIGATION  2007    reports that she has never smoked. She does not have any smokeless tobacco  history on file. Her alcohol and drug histories are not on file. family history includes Arthritis in her other; Cancer in her other; Diabetes in her other; Hyperlipidemia in her other; Hypertension in her other. No Known Allergies     Review of Systems  Constitutional: Positive for fatigue. Negative for appetite change.  Respiratory: Negative for shortness of breath.   Cardiovascular: Negative for chest pain.  Gastrointestinal: Positive for diarrhea. Negative for abdominal distention and blood in stool.  Neurological: Positive for dizziness and light-headedness.       Objective:   Physical Exam  Constitutional: She appears well-developed and well-nourished.  HENT:  Mouth/Throat: Oropharynx is clear and moist.  Neck: Neck supple. No thyromegaly present.  Cardiovascular: Normal rate and regular rhythm.   Pulmonary/Chest: Effort normal and breath sounds normal. No respiratory distress. She has no wheezes. She has no rales.  Abdominal: Soft. Bowel sounds are normal. She exhibits no distension and no mass. There is no tenderness. There is no rebound and no guarding.  Musculoskeletal: She exhibits no edema.  Lymphadenopathy:    She has no cervical adenopathy.       Assessment:     #1 patient has persistent symptoms of dizziness and lightheadedness. Blood pressure today 88/52 sitting and standing 80/50. She did not have any postural change in pulse.  Doubt POTS.    #2 history of iron deficiency anemia. Workup unrevealing for celiac disease. Recent hemoglobin has improved  #3 difficulty gaining weight and recent weight loss. Recent chemistries unremarkable.  No depression.  #4 chronic fatigue    Plan:     -  Continue follow-up with GI -Advised setting up referral to nutritionist for further evaluation and patient agrees -Obtain follow-up labs with ferritin level and early morning cortisol -Doubt postural orthostatic tachycardia syndrome but if above evaluation unrevealing and  symptoms persist consider referral for further evaluation  Eulas Post MD [facility] at [facility]

## 2015-11-09 ENCOUNTER — Other Ambulatory Visit: Payer: BLUE CROSS/BLUE SHIELD

## 2015-11-09 ENCOUNTER — Encounter (INDEPENDENT_AMBULATORY_CARE_PROVIDER_SITE_OTHER): Payer: Self-pay

## 2015-11-09 ENCOUNTER — Other Ambulatory Visit (INDEPENDENT_AMBULATORY_CARE_PROVIDER_SITE_OTHER): Payer: BLUE CROSS/BLUE SHIELD

## 2015-11-09 DIAGNOSIS — R5382 Chronic fatigue, unspecified: Secondary | ICD-10-CM | POA: Diagnosis not present

## 2015-11-09 DIAGNOSIS — R42 Dizziness and giddiness: Secondary | ICD-10-CM

## 2015-11-09 DIAGNOSIS — D649 Anemia, unspecified: Secondary | ICD-10-CM | POA: Diagnosis not present

## 2015-11-09 LAB — CORTISOL: Cortisol, Plasma: 7.5 ug/dL

## 2015-11-09 LAB — FERRITIN: Ferritin: 19.4 ng/mL (ref 10.0–291.0)

## 2015-11-21 ENCOUNTER — Telehealth: Payer: Self-pay | Admitting: Family Medicine

## 2015-11-21 NOTE — Telephone Encounter (Signed)
[name] pt returned your call for test results and state you could leave detail msg on her voice mail of her lab results.

## 2015-11-21 NOTE — Telephone Encounter (Signed)
Notified pt via voicemail

## 2015-11-29 ENCOUNTER — Other Ambulatory Visit: Payer: Self-pay | Admitting: Gastroenterology

## 2015-11-29 DIAGNOSIS — R1011 Right upper quadrant pain: Secondary | ICD-10-CM

## 2015-11-29 DIAGNOSIS — R634 Abnormal weight loss: Secondary | ICD-10-CM

## 2015-12-13 ENCOUNTER — Ambulatory Visit
Admission: RE | Admit: 2015-12-13 | Discharge: 2015-12-13 | Disposition: A | Discharge status: Home / Self Care | Payer: BLUE CROSS/BLUE SHIELD | Source: Ambulatory Visit | Attending: Gastroenterology | Admitting: Gastroenterology

## 2015-12-13 DIAGNOSIS — R1011 Right upper quadrant pain: Secondary | ICD-10-CM

## 2015-12-13 DIAGNOSIS — R634 Abnormal weight loss: Secondary | ICD-10-CM

## 2015-12-13 MED ORDER — IOPAMIDOL (ISOVUE-300) INJECTION 61%
100.0000 mL | Freq: Once | INTRAVENOUS | Status: AC | PRN
  Administered 2015-12-13: 100 mL via INTRAVENOUS

## 2015-12-18 LAB — CBC AND DIFFERENTIAL: Hemoglobin: 11.1 g/dL — AB (ref 12.0–16.0)

## 2015-12-18 LAB — HM PAP SMEAR

## 2015-12-26 ENCOUNTER — Encounter: Payer: Self-pay | Admitting: Family Medicine

## 2016-04-14 DIAGNOSIS — M7581 Other shoulder lesions, right shoulder: Secondary | ICD-10-CM

## 2016-04-14 HISTORY — DX: Other shoulder lesions, right shoulder: M75.81

## 2016-04-16 ENCOUNTER — Encounter: Payer: Self-pay | Admitting: Family Medicine

## 2016-04-16 ENCOUNTER — Ambulatory Visit (INDEPENDENT_AMBULATORY_CARE_PROVIDER_SITE_OTHER): Payer: BLUE CROSS/BLUE SHIELD | Admitting: Family Medicine

## 2016-04-16 VITALS — BP 98/60 | HR 75 | Temp 98.2°F | Wt 106.7 lb

## 2016-04-16 DIAGNOSIS — M25511 Pain in right shoulder: Secondary | ICD-10-CM | POA: Diagnosis not present

## 2016-04-16 DIAGNOSIS — M7581 Other shoulder lesions, right shoulder: Secondary | ICD-10-CM

## 2016-04-16 MED ORDER — METHYLPREDNISOLONE ACETATE 80 MG/ML IJ SUSP
80.0000 mg | Freq: Once | INTRAMUSCULAR | Status: AC
  Administered 2016-04-16: 80 mg via INTRAMUSCULAR

## 2016-04-16 NOTE — Patient Instructions (Signed)
Rotator Cuff Tendinitis Rotator cuff tendinitis is inflammation of the tough, cord-like bands that connect muscle to bone (tendons) in your rotator cuff. Your rotator cuff is the collection of all the muscles and tendons that connect your arm to your shoulder. Your rotator cuff holds the head of your upper arm bone (humerus) in the cup (fossa) of your shoulder blade (scapula). CAUSES Rotator cuff tendinitis is usually caused by overusing the joint involved.  SIGNS AND SYMPTOMS  Deep ache in the shoulder also felt on the outside upper arm over the shoulder muscle.  Point tenderness over the area that is injured.  Pain comes on gradually and becomes worse with lifting the arm to the side (abduction) or turning it inward (internal rotation).  May lead to a chronic tear: When a rotator cuff tendon becomes inflamed, it runs the risk of losing its blood supply, causing some tendon fibers to die. This increases the risk that the tendon can fray and partially or completely tear. DIAGNOSIS Rotator cuff tendinitis is diagnosed by taking a medical history, performing a physical exam, and reviewing results of imaging exams. The medical history is useful to help determine the type of rotator cuff injury. The physical exam will include looking at the injured shoulder, feeling the injured area, and watching you do range-of-motion exercises. X-ray exams are typically done to rule out other causes of shoulder pain, such as fractures. MRI is the imaging exam usually used for significant shoulder injuries. Sometimes a dye study called CT arthrogram is done, but it is not as widely used as MRI. In some institutions, special ultrasound tests may also be used to aid in the diagnosis. TREATMENT  Less Severe Cases   Use of a sling to rest the shoulder for a short period of time. Prolonged use of the sling can cause stiffness, weakness, and loss of motion of the shoulder joint.  Anti-inflammatory medicines, such as  ibuprofen or naproxen sodium, may be prescribed. More Severe Cases   Physical therapy.  Use of steroid injections into the shoulder joint.  Surgery. HOME CARE INSTRUCTIONS   Use a sling or splint until the pain decreases. Prolonged use of the sling can cause stiffness, weakness, and loss of motion of the shoulder joint.  Apply ice to the injured area:  Put ice in a plastic bag.  Place a towel between your skin and the bag.  Leave the ice on for 20 minutes, 2-3 times a day.  Try to avoid use other than gentle range of motion while your shoulder is painful. Use the shoulder and exercise only as directed by your health care provider. Stop exercises or range of motion if pain or discomfort increases, unless directed otherwise by your health care provider.  Only take over-the-counter or prescription medicines for pain, discomfort, or fever as directed by your health care provider.  If you were given a shoulder sling and straps (immobilizer), do not remove it except as directed, or until you see a health care provider for a follow-up exam. If you need to remove it, move your arm as little as possible or as directed.  You may want to sleep on several pillows at night to lessen swelling and pain. SEEK IMMEDIATE MEDICAL CARE IF:   Your shoulder pain increases or new pain develops in your arm, hand, or fingers and is not relieved with medicines.  You have new, unexplained symptoms, especially increased numbness in the hands or loss of strength.  You develop any worsening of the  problems that brought you in for care.  Your arm, hand, or fingers are numb or tingling.  Your arm, hand, or fingers are swollen, painful, or turn white or blue. MAKE SURE YOU:  Understand these instructions.  Will watch your condition.  Will get help right away if you are not doing well or get worse. This information is not intended to replace advice given to you by your health care provider. Make sure you  discuss any questions you have with your health care provider. Document Released: 06/21/2003 Document Revised: 04/21/2014 Document Reviewed: 11/10/2012 Elsevier Interactive Patient Education  2017 [employer].

## 2016-04-16 NOTE — Progress Notes (Signed)
Pre visit review using our clinic review tool, if applicable. No additional management support is needed unless otherwise documented below in the visit note. 

## 2016-04-16 NOTE — Progress Notes (Signed)
Subjective:     Patient ID: [name], female   DOB: [birth year], 46 y.o.   MRN: [id number]  HPI Patient seen with progressive right shoulder pain over the past few weeks. She's had similar problems in the past. Several years ago she had injection which helped tremendously. She denies any recent injury. No neck pain. Pain is worse with abduction and internal rotation. Starting to have more night pain. She's tried over-the-counter ibuprofen without much relief. She's not seen any swelling. No ecchymosis. No erythema.  No upper extremity numbness or weakness.  Past Medical History:  Diagnosis Date  . 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 FATIGUE 05/04/2009  . Headache(784.0)   . HYPOKALEMIA 10/17/2009  . LOW BLOOD PRESSURE 10/17/2009  . NIGHT SWEATS 05/04/2009  . Polydipsia 05/04/2009  . SHINGLES, HX OF 11/24/2008  . SINUSITIS, ACUTE 03/07/2009  . [place] SHOULDER REGION 11/09/2008   Past Surgical History:  Procedure Laterality Date  . EXTERNAL AUDITORY CANAL RECONSTRUCTION     tubes as a [place]  . TUBAL LIGATION  2007    reports that she has never smoked. She has never used smokeless tobacco. Her alcohol and drug histories are not on file. family history includes Arthritis in her other; Cancer in her other; Diabetes in her other; Hyperlipidemia in her other; Hypertension in her other. No Known Allergies   Review of Systems  Musculoskeletal: Negative for neck pain.  Neurological: Negative for weakness and numbness.       Objective:   Physical Exam  Constitutional: She appears well-developed and well-nourished.  Cardiovascular: Normal rate and regular rhythm.   Pulmonary/Chest: Effort normal and breath sounds normal. No respiratory distress. She has no wheezes. She has no rales.  Musculoskeletal:  Good range of motion cervical spine. No spinal tenderness. She has full range of motion right shoulder but has pain with internal rotation and abduction against resistance. She has some  mild subacromial tenderness No acromioclavicular tenderness. No biceps tenderness.  Neurological:  Symmetric reflexes upper extremities. Normal sensory function throughout. No evidence for any rotator cuff weakness       Assessment:     Right shoulder pain. Suspect rotator cuff tendinitis. Not resolved or improved with conservative over-the-counter therapies    Plan:      Discussed risks and benefits of corticosteroid injection and patient consented.  After prepping skin with betadine, injected 1 cc depomedrol and 2 cc of plain xylocaine with 25 gauge one and one half inch needle using posterior lateral approach and pt tolerated well. -We recommended she try icing 15-20 minutes every couple hours tonight and then start some gentle range of motion in a few days -Follow-up in 2 weeks if not significantly improved  Eulas Post MD [facility] at [facility]

## 2016-09-10 ENCOUNTER — Ambulatory Visit: Payer: BLUE CROSS/BLUE SHIELD | Admitting: Adult Health

## 2016-11-26 ENCOUNTER — Ambulatory Visit (INDEPENDENT_AMBULATORY_CARE_PROVIDER_SITE_OTHER): Payer: BLUE CROSS/BLUE SHIELD | Admitting: Family Medicine

## 2016-11-26 ENCOUNTER — Encounter: Payer: Self-pay | Admitting: Family Medicine

## 2016-11-26 VITALS — BP 98/60 | HR 87 | Temp 98.4°F | Wt 104.3 lb

## 2016-11-26 DIAGNOSIS — D649 Anemia, unspecified: Secondary | ICD-10-CM | POA: Diagnosis not present

## 2016-11-26 DIAGNOSIS — H9319 Tinnitus, unspecified ear: Secondary | ICD-10-CM | POA: Diagnosis not present

## 2016-11-26 DIAGNOSIS — R1011 Right upper quadrant pain: Secondary | ICD-10-CM

## 2016-11-26 DIAGNOSIS — R682 Dry mouth, unspecified: Secondary | ICD-10-CM

## 2016-11-26 LAB — COMPREHENSIVE METABOLIC PANEL
ALT: 10 U/L (ref 0–35)
AST: 14 U/L (ref 0–37)
Albumin: 4.3 g/dL (ref 3.5–5.2)
Alkaline Phosphatase: 68 U/L (ref 39–117)
BUN: 15 mg/dL (ref 6–23)
CO2: 30 mEq/L (ref 19–32)
Calcium: 9.1 mg/dL (ref 8.4–10.5)
Chloride: 103 mEq/L (ref 96–112)
Creatinine, Ser: 0.77 mg/dL (ref 0.40–1.20)
GFR: 86.14 mL/min (ref >60.00)
Glucose, Bld: 144 mg/dL — ABNORMAL HIGH (ref 70–99)
Potassium: 3.7 mEq/L (ref 3.5–5.1)
Sodium: 139 mEq/L (ref 135–145)
Total Bilirubin: 0.4 mg/dL (ref 0.2–1.2)
Total Protein: 6.4 g/dL (ref 6.0–8.3)

## 2016-11-26 LAB — CBC WITH DIFFERENTIAL/PLATELET
Basophils Absolute: 0 10*3/uL (ref 0.0–0.1)
Basophils Relative: 0.8 % (ref 0.0–3.0)
Eosinophils Absolute: 0 10*3/uL (ref 0.0–0.7)
Eosinophils Relative: 0.3 % (ref 0.0–5.0)
HCT: 36.9 % (ref 36.0–46.0)
Hemoglobin: 12.1 g/dL (ref 12.0–15.0)
Lymphocytes Relative: 22.4 % (ref 12.0–46.0)
Lymphs Abs: 1.1 10*3/uL (ref 0.7–4.0)
MCHC: 32.9 g/dL (ref 30.0–36.0)
MCV: 98.7 fl (ref 78.0–100.0)
Monocytes Absolute: 0.3 10*3/uL (ref 0.1–1.0)
Monocytes Relative: 5.3 % (ref 3.0–12.0)
Neutro Abs: 3.5 10*3/uL (ref 1.4–7.7)
Neutrophils Relative %: 71.2 % (ref 43.0–77.0)
Platelets: 270 10*3/uL (ref 150.0–400.0)
RBC: 3.74 Mil/uL — ABNORMAL LOW (ref 3.87–5.11)
RDW: 13.2 % (ref 11.5–15.5)
WBC: 5 10*3/uL (ref 4.0–10.5)

## 2016-11-26 LAB — FERRITIN: Ferritin: 10.8 ng/mL (ref 10.0–291.0)

## 2016-11-26 NOTE — Patient Instructions (Signed)

## 2016-11-26 NOTE — Progress Notes (Signed)
Subjective:     Patient ID: [name], female   DOB: 11/12/1970, 46 y.o.   MRN: [id number]  HPI Patient seen with multiple complaints today. She has IBS which has been followed by GI in the past and also has history of iron deficiency anemia which is felt to be related to menstrual loss. She's had extensive GI workup in the past. She had CT abdomen pelvis last summer showed some questionable thickening jejunum. She had small bowel biopsy which came back unremarkable. She has diarrhea predominant IBS symptoms.   She's had some chronic mostly right upper quadrant abdominal pain symptoms following eating but symptoms are very inconsistent. Previous ultrasound showed no gallstones or gallbladder wall thickening. Patient had nuclear medicine gallbladder imaging studies ordered (per GI) but never went for that study  Patient is worried that her hemoglobin may be drifting down again. She's had low ferritin in the past which had gradually increased last year with iron replacement but she has difficulty with oral iron replacement  Patient complains of increased thirst and some urine frequency but no burning with urination. Blood sugars have consistently been stable in the past.  Patient has intermittent ringing of the left ear. No vertigo. No hearing loss. 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 Has not had her hearing tested recently.  Past Medical History:  Diagnosis Date  . 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 FATIGUE 05/04/2009  . Headache(784.0)   . HYPOKALEMIA 10/17/2009  . LOW BLOOD PRESSURE 10/17/2009  . NIGHT SWEATS 05/04/2009  . Polydipsia 05/04/2009  . SHINGLES, HX OF 11/24/2008  . SINUSITIS, ACUTE 03/07/2009  . [place] SHOULDER REGION 11/09/2008   Past Surgical History:  Procedure Laterality Date  . EXTERNAL AUDITORY CANAL RECONSTRUCTION     tubes as a [place]  . TUBAL LIGATION  2007    reports that she has never  smoked. She has never used smokeless tobacco. Her alcohol and drug histories are not on file. family history includes Arthritis in her other; Cancer in her other; Diabetes in her other; Hyperlipidemia in her other; Hypertension in her other. No Known Allergies   Review of Systems  Constitutional: Positive for fatigue. Negative for appetite change, fever and unexpected weight change.  HENT: Negative for hearing loss.   Respiratory: Negative for cough.   Cardiovascular: Negative for chest pain.  Gastrointestinal: Positive for abdominal pain and diarrhea. Negative for abdominal distention, nausea and vomiting.  Genitourinary: Positive for frequency. Negative for dysuria and flank pain.  Neurological: Positive for dizziness and light-headedness. Negative for weakness.       Objective:   Physical Exam  Constitutional: She appears well-developed and well-nourished.  HENT:  Mouth/Throat: Oropharynx is clear and moist.  Neck: Neck supple. No thyromegaly present.  Cardiovascular: Normal rate and regular rhythm.   Pulmonary/Chest: Effort normal and breath sounds normal. No respiratory distress. She has no wheezes. She has no rales.  Abdominal: Soft. Bowel sounds are normal. She exhibits no distension and no mass. There is no tenderness. There is no rebound and no guarding.  Musculoskeletal: She exhibits no edema.       Assessment:     #1 history of iron deficiency anemia. She's had extensive GI workup in the past which was unrevealing. No evidence for celiac disease. Suspect related to menstrual loss  #2 IBS diarrhea predominant not responsive to medications per GI  #3 increased thirst  #4 intermittent left ear tinnitus. She does not have  any vertigo or hearing changes and symptoms are very intermittent  #5 chronic intermittent right quadrant abdominal pain    Plan:     -Repeat labs with comprehensive metabolic panel, CBC, ferritin, TIBC, serum iron -We discussed possible  gallbladder nuclear study but she declines at this time -Have recommended audiology assessment regarding hearing  Eulas Post MD [facility] at [facility]

## 2016-11-27 LAB — IRON AND TIBC
%SAT: 11 % (ref 11–50)
Iron: 34 ug/dL — ABNORMAL LOW (ref 40–190)
TIBC: 298 ug/dL (ref 250–450)
UIBC: 264 ug/dL

## 2016-12-01 ENCOUNTER — Telehealth: Payer: Self-pay | Admitting: Family Medicine

## 2016-12-01 NOTE — Telephone Encounter (Signed)
° ° °  Pt would like a call back about her labs

## 2016-12-02 ENCOUNTER — Other Ambulatory Visit: Payer: Self-pay | Admitting: Family Medicine

## 2016-12-02 DIAGNOSIS — R5382 Chronic fatigue, unspecified: Secondary | ICD-10-CM

## 2016-12-02 NOTE — Telephone Encounter (Signed)
Patient is aware of lab results.  Lab order placed.  Patient will call back for a lab appointment.

## 2017-01-26 ENCOUNTER — Ambulatory Visit
Admission: RE | Admit: 2017-01-26 | Discharge: 2017-01-26 | Disposition: A | Discharge status: Home / Self Care | Payer: BLUE CROSS/BLUE SHIELD | Source: Ambulatory Visit | Attending: Obstetrics and Gynecology | Admitting: Obstetrics and Gynecology

## 2017-01-26 ENCOUNTER — Other Ambulatory Visit: Payer: Self-pay | Admitting: Obstetrics and Gynecology

## 2017-01-26 DIAGNOSIS — R928 Other abnormal and inconclusive findings on diagnostic imaging of breast: Secondary | ICD-10-CM

## 2017-04-29 ENCOUNTER — Encounter: Payer: Self-pay | Admitting: Family Medicine

## 2017-04-29 ENCOUNTER — Ambulatory Visit: Payer: Self-pay | Admitting: *Deleted

## 2017-04-29 ENCOUNTER — Ambulatory Visit (INDEPENDENT_AMBULATORY_CARE_PROVIDER_SITE_OTHER): Payer: Managed Care, Other (non HMO) | Admitting: Family Medicine

## 2017-04-29 VITALS — BP 90/60 | HR 64 | Temp 98.2°F | Wt 109.6 lb

## 2017-04-29 DIAGNOSIS — R5383 Other fatigue: Secondary | ICD-10-CM | POA: Diagnosis not present

## 2017-04-29 DIAGNOSIS — D509 Iron deficiency anemia, unspecified: Secondary | ICD-10-CM | POA: Diagnosis not present

## 2017-04-29 DIAGNOSIS — R42 Dizziness and giddiness: Secondary | ICD-10-CM | POA: Diagnosis not present

## 2017-04-29 NOTE — Progress Notes (Signed)
Subjective:     Patient ID: [name], female   DOB: [birth year], 47 y.o.   MRN: [id number]  HPI Patient is seen with complaints of general fatigue and some lightheadedness. She's had a long history of fatigue in the past and history of frequent anemia. She is followed by GYN. She has menses monthly and sometimes heavy. Her last hemoglobin was actually improving a few months ago but ferritin still low at 10.   Patient relates she had recent right breast mass which required further imaging. She thinks the stress of this may have caused some of her fatigue. She is sleeping well. Appetite and weight are stable weight and is actually up a few pounds today compared to last visit.  She has long history of fairly low blood pressure. She drinks lots of water but still seems to be low at times. She had extensive GI workup in the past. No evidence for celiac disease. Frequently has abdominal cramps symptoms. No localizing pain.  She had episode of diarrhea couple of days ago with nausea but those symptoms have resolved at this time.  Past Medical History:  Diagnosis Date  . 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 FATIGUE 05/04/2009  . Headache(784.0)   . HYPOKALEMIA 10/17/2009  . LOW BLOOD PRESSURE 10/17/2009  . NIGHT SWEATS 05/04/2009  . Polydipsia 05/04/2009  . SHINGLES, HX OF 11/24/2008  . SINUSITIS, ACUTE 03/07/2009  . [place] SHOULDER REGION 11/09/2008   Past Surgical History:  Procedure Laterality Date  . EXTERNAL AUDITORY CANAL RECONSTRUCTION     tubes as a [place]  . TUBAL LIGATION  2007    reports that  has never smoked. she has never used smokeless tobacco. Her alcohol and drug histories are not on file. family history includes Arthritis in her other; Cancer in her other; Diabetes in her other; Hyperlipidemia in her other; Hypertension in her other. No Known Allergies   Review of Systems  Constitutional: Positive for fatigue. Negative for chills and fever.  Respiratory: Negative for  shortness of breath.   Cardiovascular: Negative for chest pain.  Genitourinary: Negative for dysuria.  Neurological: Positive for dizziness and light-headedness. Negative for headaches.  Hematological: Negative for adenopathy.  Psychiatric/Behavioral: Negative for dysphoric mood.       Objective:   Physical Exam  Constitutional: She appears well-developed and well-nourished.  Neck: Neck supple.  Cardiovascular: Normal rate and regular rhythm.  Pulmonary/Chest: Effort normal and breath sounds normal. No respiratory distress. She has no wheezes. She has no rales.  Abdominal: Soft. Bowel sounds are normal. She exhibits no distension and no mass. There is no tenderness. There is no rebound and no guarding.  Musculoskeletal: She exhibits no edema.       Assessment:     #1 fatigue. Probably multifactorial. Long history of anemia  #2 hx of iron deficiency anemia.  Ruled out celiac disease.    Plan:     -Recheck CBC and ferritin -Continue over-the-counter iron supplementation -Suggested she add supplement such as Gatorade if blood pressure remains low with adequate water consumption  Eulas Post MD [facility] at [facility]

## 2017-04-29 NOTE — Telephone Encounter (Signed)
Pt called stating that she is having fatigue. She is also having some diarrhea. Feels dehydrated. Thinks she is anemic. Appointment made for today per pt request.    Reason for Disposition . [1] MILD weakness (i.e., does not interfere with ability to work, go to school, normal activities) AND [2] persists > 1 week  Answer Assessment - Initial Assessment Questions 1. DESCRIPTION: "Describe how you are feeling."     tired 2. SEVERITY: "How bad is it?"  "Can you stand and walk?"   - MILD - Feels weak or tired, but does not interfere with work, school or normal activities   - [place] to stand and walk; weakness interferes with work, school, or normal activities   - SEVERE - Unable to stand or walk     moderate 3. ONSET:  "When did the weakness begin?"     For years 4. CAUSE: "What do you think is causing the weakness?"     Anemic, dehydration 5. MEDICINES: "Have you recently started a new medicine or had a change in the amount of a medicine?"     no 6. OTHER SYMPTOMS: "Do you have any other symptoms?" (e.g., chest pain, fever, cough, SOB, vomiting, diarrhea, bleeding)     occassionally cough, diarrhea 7. PREGNANCY: "Is there any chance you are pregnant?" "When was your last menstrual period?"     LMP 2 weeks ago, had tubal ligation  Protocols used: WEAKNESS (GENERALIZED) AND FATIGUE-A-AH

## 2017-04-30 LAB — CBC WITH DIFFERENTIAL/PLATELET
Basophils Absolute: 0.1 10*3/uL (ref 0.0–0.1)
Basophils Relative: 1.3 % (ref 0.0–3.0)
Eosinophils Absolute: 0.1 10*3/uL (ref 0.0–0.7)
Eosinophils Relative: 1.3 % (ref 0.0–5.0)
HCT: 35.5 % — ABNORMAL LOW (ref 36.0–46.0)
Hemoglobin: 11.7 g/dL — ABNORMAL LOW (ref 12.0–15.0)
Lymphocytes Relative: 44 % (ref 12.0–46.0)
Lymphs Abs: 2.7 10*3/uL (ref 0.7–4.0)
MCHC: 33 g/dL (ref 30.0–36.0)
MCV: 96.9 fl (ref 78.0–100.0)
Monocytes Absolute: 0.5 10*3/uL (ref 0.1–1.0)
Monocytes Relative: 8.8 % (ref 3.0–12.0)
Neutro Abs: 2.7 10*3/uL (ref 1.4–7.7)
Neutrophils Relative %: 44.6 % (ref 43.0–77.0)
Platelets: 245 10*3/uL (ref 150.0–400.0)
RBC: 3.66 Mil/uL — ABNORMAL LOW (ref 3.87–5.11)
RDW: 13.7 % (ref 11.5–15.5)
WBC: 6.2 10*3/uL (ref 4.0–10.5)

## 2017-04-30 LAB — FERRITIN: Ferritin: 6 ng/mL — ABNORMAL LOW (ref 10.0–291.0)

## 2017-05-01 ENCOUNTER — Telehealth: Payer: Self-pay | Admitting: Family Medicine

## 2017-05-01 NOTE — Telephone Encounter (Signed)
Not sure what patient means by "gall bladder results".  Left message on machine for patient to clarify.

## 2017-05-01 NOTE — Telephone Encounter (Signed)
Patient was making sure that the provider was checking her gallbladder when he was doing her labs. ( She had not had imaging ) Reviewed her numbers and scheduled her repeat labs in April.

## 2017-05-01 NOTE — Telephone Encounter (Signed)
Pt called back to ask about her "gall bladder results". No order found for labs. Please call pt to schedule labs.

## 2017-06-24 ENCOUNTER — Encounter: Payer: Self-pay | Admitting: Family Medicine

## 2017-06-24 ENCOUNTER — Ambulatory Visit (INDEPENDENT_AMBULATORY_CARE_PROVIDER_SITE_OTHER): Payer: Managed Care, Other (non HMO) | Admitting: Family Medicine

## 2017-06-24 VITALS — BP 96/64 | HR 61 | Temp 98.2°F | Wt 105.3 lb

## 2017-06-24 DIAGNOSIS — R5383 Other fatigue: Secondary | ICD-10-CM

## 2017-06-24 DIAGNOSIS — D649 Anemia, unspecified: Secondary | ICD-10-CM

## 2017-06-24 DIAGNOSIS — B9789 Other viral agents as the cause of diseases classified elsewhere: Secondary | ICD-10-CM | POA: Diagnosis not present

## 2017-06-24 DIAGNOSIS — J069 Acute upper respiratory infection, unspecified: Secondary | ICD-10-CM | POA: Diagnosis not present

## 2017-06-24 MED ORDER — BENZONATATE 100 MG PO CAPS: 100.0000 mg | ORAL_CAPSULE | Freq: Three times a day (TID) | ORAL | 0 refills | Status: DC | PRN

## 2017-06-24 NOTE — Progress Notes (Signed)
Subjective:     Patient ID: [name], female   DOB: [birth year], 47 y.o.   MRN: [id number]  HPI Patient seen with 2 day history of URI symptoms with body aches, fatigue, sore throat, cough. Denies any nausea or vomiting. Cough is mostly dry. Patient is nonsmoker. No history of asthma.  She has some chronic fatigue issues. She has history of chronic low-grade anemia. Has been diagnosed with endometriosis per GYN. Menses are fairly regular. She had recent ferritin level few months ago 6 with hemoglobin 11.7.  She's been intolerant of multiple forms of iron in the past.  Past Medical History:  Diagnosis Date  . 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 FATIGUE 05/04/2009  . Headache(784.0)   . HYPOKALEMIA 10/17/2009  . LOW BLOOD PRESSURE 10/17/2009  . NIGHT SWEATS 05/04/2009  . Polydipsia 05/04/2009  . SHINGLES, HX OF 11/24/2008  . SINUSITIS, ACUTE 03/07/2009  . [place] SHOULDER REGION 11/09/2008   Past Surgical History:  Procedure Laterality Date  . EXTERNAL AUDITORY CANAL RECONSTRUCTION     tubes as a [place]  . TUBAL LIGATION  2007    reports that  has never smoked. she has never used smokeless tobacco. Her alcohol and drug histories are not on file. family history includes Arthritis in her other; Cancer in her other; Diabetes in her other; Hyperlipidemia in her other; Hypertension in her other. No Known Allergies   Review of Systems  Constitutional: Positive for fatigue. Negative for chills and fever.  HENT: Positive for congestion and sore throat.   Respiratory: Positive for cough. Negative for shortness of breath and wheezing.   Gastrointestinal: Negative for diarrhea, nausea and vomiting.  Neurological: Negative for dizziness.       Objective:   Physical Exam  Constitutional: She appears well-developed and well-nourished.  HENT:  Right Ear: External ear normal.  Left Ear: External ear normal.  Mouth/Throat: Oropharynx is clear and moist. No oropharyngeal exudate.  Neck:  Neck supple.  Cardiovascular: Normal rate and regular rhythm.  Pulmonary/Chest: Effort normal and breath sounds normal. No respiratory distress. She has no wheezes. She has no rales.  Lymphadenopathy:    She has no cervical adenopathy.       Assessment:     #1 probable viral URI with cough. Nonfocal exam. Afebrile  #2 history of iron deficiency anemia    Plan:     -Tessalon Perles 100 mg every 8 hours as needed for cough -Plenty of fluids and rest. Work note written for today and tomorrow -Follow-up promptly for any fever or worsening symptoms -Order for future labs with ferritin, CBC, 25-hydroxy vitamin D level  Eulas Post MD [facility] at [facility]

## 2017-06-24 NOTE — Patient Instructions (Signed)
Upper Respiratory Infection, Adult Most upper respiratory infections (URIs) are a viral infection of the air passages leading to the lungs. A URI affects the nose, throat, and upper air passages. The most common type of URI is nasopharyngitis and is typically referred to as "the common cold." URIs run their course and usually go away on their own. Most of the time, a URI does not require medical attention, but sometimes a bacterial infection in the upper airways can follow a viral infection. This is called a secondary infection. Sinus and middle ear infections are common types of secondary upper respiratory infections. Bacterial pneumonia can also complicate a URI. A URI can worsen asthma and chronic obstructive pulmonary disease (COPD). Sometimes, these complications can require emergency medical care and may be life threatening. What are the causes? Almost all URIs are caused by viruses. A virus is a type of germ and can spread from one person to another. What increases the risk? You may be at risk for a URI if:  You smoke.  You have chronic heart or lung disease.  You have a weakened defense (immune) system.  You are very young or very old.  You have nasal allergies or asthma.  You work in crowded or poorly ventilated areas.  You work in health care facilities or schools.  What are the signs or symptoms? Symptoms typically develop 2-3 days after you come in contact with a cold virus. Most viral URIs last 7-10 days. However, viral URIs from the influenza virus (flu virus) can last 14-18 days and are typically more severe. Symptoms may include:  Runny or stuffy (congested) nose.  Sneezing.  Cough.  Sore throat.  Headache.  Fatigue.  Fever.  Loss of appetite.  Pain in your forehead, behind your eyes, and over your cheekbones (sinus pain).  Muscle aches.  How is this diagnosed? Your health care provider may diagnose a URI by:  Physical exam.  Tests to check that your  symptoms are not due to another condition such as: ? Strep throat. ? Sinusitis. ? Pneumonia. ? Asthma.  How is this treated? A URI goes away on its own with time. It cannot be cured with medicines, but medicines may be prescribed or recommended to relieve symptoms. Medicines may help:  Reduce your fever.  Reduce your cough.  Relieve nasal congestion.  Follow these instructions at home:  Take medicines only as directed by your health care provider.  Gargle warm saltwater or take cough drops to comfort your throat as directed by your health care provider.  Use a warm mist humidifier or inhale steam from a shower to increase air moisture. This may make it easier to breathe.  Drink enough fluid to keep your urine clear or pale yellow.  Eat soups and other clear broths and maintain good nutrition.  Rest as needed.  Return to work when your temperature has returned to normal or as your health care provider advises. You may need to stay home longer to avoid infecting others. You can also use a face mask and careful hand washing to prevent spread of the virus.  Increase the usage of your inhaler if you have asthma.  Do not use any tobacco products, including cigarettes, chewing tobacco, or electronic cigarettes. If you need help quitting, ask your health care provider. How is this prevented? The best way to protect yourself from getting a cold is to practice good hygiene.  Avoid oral or hand contact with people with cold symptoms.  Wash your   hands often if contact occurs.  There is no clear evidence that vitamin C, vitamin E, echinacea, or exercise reduces the chance of developing a cold. However, it is always recommended to get plenty of rest, exercise, and practice good nutrition. Contact a health care provider if:  You are getting worse rather than better.  Your symptoms are not controlled by medicine.  You have chills.  You have worsening shortness of breath.  You have  brown or red mucus.  You have yellow or brown nasal discharge.  You have pain in your face, especially when you bend forward.  You have a fever.  You have swollen neck glands.  You have pain while swallowing.  You have white areas in the back of your throat. Get help right away if:  You have severe or persistent: ? Headache. ? Ear pain. ? Sinus pain. ? Chest pain.  You have chronic lung disease and any of the following: ? Wheezing. ? Prolonged cough. ? Coughing up blood. ? A change in your usual mucus.  You have a stiff neck.  You have changes in your: ? Vision. ? Hearing. ? Thinking. ? Mood. This information is not intended to replace advice given to you by your health care provider. Make sure you discuss any questions you have with your health care provider. Document Released: 09/24/2000 Document Revised: 12/02/2015 Document Reviewed: 07/06/2013 Elsevier Interactive Patient Education  2018 Elsevier Inc.  

## 2017-07-31 ENCOUNTER — Other Ambulatory Visit: Payer: Managed Care, Other (non HMO)

## 2017-08-05 ENCOUNTER — Other Ambulatory Visit (INDEPENDENT_AMBULATORY_CARE_PROVIDER_SITE_OTHER): Payer: Managed Care, Other (non HMO)

## 2017-08-05 DIAGNOSIS — D649 Anemia, unspecified: Secondary | ICD-10-CM | POA: Diagnosis not present

## 2017-08-05 DIAGNOSIS — R5383 Other fatigue: Secondary | ICD-10-CM

## 2017-08-05 LAB — CBC WITH DIFFERENTIAL/PLATELET
Basophils Absolute: 0 10*3/uL (ref 0.0–0.1)
Basophils Relative: 0.7 % (ref 0.0–3.0)
Eosinophils Absolute: 0.1 10*3/uL (ref 0.0–0.7)
Eosinophils Relative: 1.8 % (ref 0.0–5.0)
HCT: 35.7 % — ABNORMAL LOW (ref 36.0–46.0)
Hemoglobin: 12.1 g/dL (ref 12.0–15.0)
Lymphocytes Relative: 41.1 % (ref 12.0–46.0)
Lymphs Abs: 2.1 10*3/uL (ref 0.7–4.0)
MCHC: 33.8 g/dL (ref 30.0–36.0)
MCV: 94.9 fl (ref 78.0–100.0)
Monocytes Absolute: 0.4 10*3/uL (ref 0.1–1.0)
Monocytes Relative: 8 % (ref 3.0–12.0)
Neutro Abs: 2.5 10*3/uL (ref 1.4–7.7)
Neutrophils Relative %: 48.4 % (ref 43.0–77.0)
Platelets: 269 10*3/uL (ref 150.0–400.0)
RBC: 3.77 Mil/uL — ABNORMAL LOW (ref 3.87–5.11)
RDW: 14 % (ref 11.5–15.5)
WBC: 5.1 10*3/uL (ref 4.0–10.5)

## 2017-08-05 LAB — FERRITIN: Ferritin: 6.6 ng/mL — ABNORMAL LOW (ref 10.0–291.0)

## 2017-08-05 LAB — VITAMIN D 25 HYDROXY (VIT D DEFICIENCY, FRACTURES): VITD: 13.36 ng/mL — ABNORMAL LOW (ref 30.00–100.00)

## 2017-08-06 ENCOUNTER — Other Ambulatory Visit: Payer: Self-pay | Admitting: Family Medicine

## 2017-08-06 DIAGNOSIS — E559 Vitamin D deficiency, unspecified: Secondary | ICD-10-CM

## 2017-08-06 MED ORDER — VITAMIN D (ERGOCALCIFEROL) 1.25 MG (50000 UNIT) PO CAPS: 50000.0000 [IU] | ORAL_CAPSULE | ORAL | 0 refills | Status: DC

## 2017-10-14 ENCOUNTER — Ambulatory Visit (INDEPENDENT_AMBULATORY_CARE_PROVIDER_SITE_OTHER): Payer: Managed Care, Other (non HMO) | Admitting: Family Medicine

## 2017-10-14 ENCOUNTER — Encounter: Payer: Self-pay | Admitting: Family Medicine

## 2017-10-14 VITALS — BP 92/58 | HR 75 | Temp 98.4°F | Wt 106.1 lb

## 2017-10-14 DIAGNOSIS — E559 Vitamin D deficiency, unspecified: Secondary | ICD-10-CM | POA: Diagnosis not present

## 2017-10-14 DIAGNOSIS — R51 Headache: Secondary | ICD-10-CM | POA: Diagnosis not present

## 2017-10-14 DIAGNOSIS — R519 Headache, unspecified: Secondary | ICD-10-CM

## 2017-10-14 DIAGNOSIS — M549 Dorsalgia, unspecified: Secondary | ICD-10-CM

## 2017-10-14 MED ORDER — CYCLOBENZAPRINE HCL 5 MG PO TABS: 5.0000 mg | ORAL_TABLET | Freq: Three times a day (TID) | ORAL | 1 refills | Status: DC | PRN

## 2017-10-14 NOTE — Progress Notes (Signed)
  Subjective:     Patient ID: [name], female   DOB: 1970/06/11, 47 y.o.   MRN: [id number]  HPI Patient seen with 2 new complaints today.  First is upper back pain which is mostly on the right side upper thoracic area. Present for couple days at least. She works extremely long hours on computer. Denies any injury. Her pain is mostly medial and superior to the scapular area. No rash. No cough. No dyspnea. No pleuritic pain. Sore with movement. Her husband massages the area without much improvement. She feels that certain muscles are "tightening up "  Second issue is bilateral frontal daily headache for the past couple weeks. This is mostly early in the morning and somewhat of a bandlike distribution bifrontal. Reported history of migraines. She's taken ibuprofen which helps. Denies any nausea or vomiting. Mild light sensitivity. No fever. No sinusitis symptoms.  She has low vitamin D. We obtained labs back in April with level XIII. Is taking 50,000 units once weekly for replacement  Past Medical History:  Diagnosis Date  . 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 FATIGUE 05/04/2009  . Headache(784.0)   . HYPOKALEMIA 10/17/2009  . LOW BLOOD PRESSURE 10/17/2009  . NIGHT SWEATS 05/04/2009  . Polydipsia 05/04/2009  . SHINGLES, HX OF 11/24/2008  . SINUSITIS, ACUTE 03/07/2009  . [place] SHOULDER REGION 11/09/2008   Past Surgical History:  Procedure Laterality Date  . EXTERNAL AUDITORY CANAL RECONSTRUCTION     tubes as a [place]  . TUBAL LIGATION  2007    reports that she has never smoked. She has never used smokeless tobacco. Her alcohol and drug histories are not on file. family history includes Arthritis in her other; Cancer in her other; Diabetes in her other; Hyperlipidemia in her other; Hypertension in her other. No Known Allergies   Review of Systems  Constitutional: Negative for chills, fatigue and fever.  Respiratory: Negative for cough, shortness of breath and wheezing.    Cardiovascular: Negative for chest pain.  Musculoskeletal: Positive for back pain.  Skin: Negative for rash.  Neurological: Positive for headaches.  Hematological: Negative for adenopathy.       Objective:   Physical Exam  Constitutional: She is oriented to person, place, and time. She appears well-developed and well-nourished.  Eyes: Pupils are equal, round, and reactive to light.  Neck: Normal range of motion.  Cardiovascular: Normal rate and regular rhythm.  Pulmonary/Chest: Effort normal and breath sounds normal.  Musculoskeletal:  She has some muscular tenderness upper thoracic area just medial to the right scapula. No skin rash  Lymphadenopathy:    She has no cervical adenopathy.  Neurological: She is alert and oriented to person, place, and time. No cranial nerve deficit.  Skin: No rash noted.       Assessment:     #1 upper back pain right upper thoracic region. Suspect muscular  #2 daily headache. Suspect tension-type headache  #3 history of low vitamin D with level of 13 back in April. She is now on replacement with 50,000 international units weekly    Plan:     -Continue with conservative measures such as heat and muscle massage -Cautious trial of low-dose Flexeril 5 mg daily at bedtime. Hopefully this can help her upper back and possibly headaches as well -Regular aerobic exercise as tolerated -Patient follow-up for repeat vitamin D level later this month  Eulas Post MD [facility]

## 2017-10-15 DIAGNOSIS — E559 Vitamin D deficiency, unspecified: Secondary | ICD-10-CM | POA: Insufficient documentation

## 2017-11-11 ENCOUNTER — Other Ambulatory Visit: Payer: Self-pay

## 2017-12-16 ENCOUNTER — Other Ambulatory Visit (INDEPENDENT_AMBULATORY_CARE_PROVIDER_SITE_OTHER): Payer: Managed Care, Other (non HMO)

## 2017-12-16 DIAGNOSIS — E559 Vitamin D deficiency, unspecified: Secondary | ICD-10-CM

## 2017-12-16 LAB — VITAMIN D 25 HYDROXY (VIT D DEFICIENCY, FRACTURES): VITD: 32.25 ng/mL (ref 30.00–100.00)

## 2017-12-21 ENCOUNTER — Other Ambulatory Visit: Payer: Self-pay | Admitting: Obstetrics and Gynecology

## 2017-12-21 DIAGNOSIS — N6310 Unspecified lump in the right breast, unspecified quadrant: Secondary | ICD-10-CM

## 2017-12-23 ENCOUNTER — Ambulatory Visit: Payer: Managed Care, Other (non HMO) | Admitting: Family Medicine

## 2017-12-23 DIAGNOSIS — Z0289 Encounter for other administrative examinations: Secondary | ICD-10-CM

## 2017-12-30 ENCOUNTER — Other Ambulatory Visit: Payer: Self-pay

## 2017-12-30 ENCOUNTER — Ambulatory Visit (INDEPENDENT_AMBULATORY_CARE_PROVIDER_SITE_OTHER): Payer: Managed Care, Other (non HMO) | Admitting: Family Medicine

## 2017-12-30 ENCOUNTER — Encounter: Payer: Self-pay | Admitting: Family Medicine

## 2017-12-30 VITALS — BP 90/58 | HR 69 | Temp 98.6°F | Resp 15 | Ht 67.0 in | Wt 110.9 lb

## 2017-12-30 DIAGNOSIS — R590 Localized enlarged lymph nodes: Secondary | ICD-10-CM

## 2017-12-30 DIAGNOSIS — E559 Vitamin D deficiency, unspecified: Secondary | ICD-10-CM | POA: Diagnosis not present

## 2017-12-30 DIAGNOSIS — D509 Iron deficiency anemia, unspecified: Secondary | ICD-10-CM

## 2017-12-30 HISTORY — DX: Localized enlarged lymph nodes: R59.0

## 2017-12-30 NOTE — Patient Instructions (Signed)
Follow up for any lymph node enlargement or other concerns.

## 2017-12-30 NOTE — Progress Notes (Signed)
  Subjective:     Patient ID: [name], female   DOB: 06/29/1970, 47 y.o.   MRN: [id number]  HPI Patient seen for the following concerns  Just yesterday noticed slightly tender node in her right groin region. She states about 7 years ago this was biopsied and normal. She has not noted any skin rashes or right lower extremity skin lesions. She's had some night sweats for several months. Her weight is actually up compared to last visit. Appetite is stable. No fever.  History of iron deficiency anemia likely on the basis of menstrual loss. She's had extensive GI workup in the past. She has reported IBS. No celiac disease.  She is requesting repeat CBC. Menses have been regular.  She sees gyn yearly.  Vitamin D deficiency. Levels 13. Requesting repeat levels.  Currently not on any iron.  Past Medical History:  Diagnosis Date  . 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 FATIGUE 05/04/2009  . Headache(784.0)   . HYPOKALEMIA 10/17/2009  . LOW BLOOD PRESSURE 10/17/2009  . NIGHT SWEATS 05/04/2009  . Polydipsia 05/04/2009  . SHINGLES, HX OF 11/24/2008  . SINUSITIS, ACUTE 03/07/2009  . [place] SHOULDER REGION 11/09/2008   Past Surgical History:  Procedure Laterality Date  . EXTERNAL AUDITORY CANAL RECONSTRUCTION     tubes as a [place]  . TUBAL LIGATION  2007    reports that she has never smoked. She has never used smokeless tobacco. Her alcohol and drug histories are not on file. family history includes Arthritis in her other; Cancer in her other; Diabetes in her other; Hyperlipidemia in her other; Hypertension in her other. No Known Allergies   Review of Systems  Constitutional: Positive for fatigue. Negative for appetite change and unexpected weight change.  Respiratory: Negative for cough and shortness of breath.   Cardiovascular: Negative for chest pain.  Gastrointestinal: Negative for abdominal pain and blood in stool.  Genitourinary: Negative for dysuria.  Hematological: Positive for  adenopathy. Does not bruise/bleed easily.       Objective:   Physical Exam  Constitutional: She appears well-developed and well-nourished.  Neck: Neck supple. No thyromegaly present.  Cardiovascular: Normal rate and regular rhythm.  Pulmonary/Chest: Effort normal and breath sounds normal. She has no wheezes. She has no rales.  No axillary or supraclavicular adenopathy.  Abdominal: Soft. She exhibits no mass. There is no tenderness. There is no guarding.  Genitourinary:  Genitourinary Comments: Right inguinal area examined- has a couple of small (< 1 cm) nodes right inguinal area slightly tender.  She has very similar nodes (though less tender) on left side.  Musculoskeletal: She exhibits no edema.  Lymphadenopathy:    She has no cervical adenopathy.       Assessment:     #1 history of iron deficiency anemia (most likely menstrual loss) with negative previous GI workup  #2 history of vitamin D deficiency  #3 right inguinal lymphadenopathy- suspect benign reactive.  No other worrisome adenopathy on exam    Plan:     -repeat labs with CBC, ferritin, 25 OH Vit D -recommend observation at this time regarding nodes and touch base for any growth or other changes.  Eulas Post MD [facility] at [facility]

## 2017-12-31 LAB — CBC WITH DIFFERENTIAL/PLATELET
Basophils Absolute: 0 10*3/uL (ref 0.0–0.1)
Basophils Relative: 0.7 % (ref 0.0–3.0)
Eosinophils Absolute: 0.1 10*3/uL (ref 0.0–0.7)
Eosinophils Relative: 1.4 % (ref 0.0–5.0)
HCT: 33.6 % — ABNORMAL LOW (ref 36.0–46.0)
Hemoglobin: 11.3 g/dL — ABNORMAL LOW (ref 12.0–15.0)
Lymphocytes Relative: 33.1 % (ref 12.0–46.0)
Lymphs Abs: 2 10*3/uL (ref 0.7–4.0)
MCHC: 33.8 g/dL (ref 30.0–36.0)
MCV: 93.7 fl (ref 78.0–100.0)
Monocytes Absolute: 0.5 10*3/uL (ref 0.1–1.0)
Monocytes Relative: 7.6 % (ref 3.0–12.0)
Neutro Abs: 3.4 10*3/uL (ref 1.4–7.7)
Neutrophils Relative %: 57.2 % (ref 43.0–77.0)
Platelets: 257 10*3/uL (ref 150.0–400.0)
RBC: 3.58 Mil/uL — ABNORMAL LOW (ref 3.87–5.11)
RDW: 14.7 % (ref 11.5–15.5)
WBC: 5.9 10*3/uL (ref 4.0–10.5)

## 2017-12-31 LAB — FERRITIN: Ferritin: 4.4 ng/mL — ABNORMAL LOW (ref 10.0–291.0)

## 2017-12-31 LAB — VITAMIN D 25 HYDROXY (VIT D DEFICIENCY, FRACTURES): VITD: 29.24 ng/mL — ABNORMAL LOW (ref 30.00–100.00)

## 2018-01-01 ENCOUNTER — Other Ambulatory Visit: Payer: Self-pay

## 2018-01-01 MED ORDER — VITAMIN D (ERGOCALCIFEROL) 1.25 MG (50000 UNIT) PO CAPS: 50000.0000 [IU] | ORAL_CAPSULE | ORAL | 0 refills | Status: DC

## 2018-02-05 ENCOUNTER — Other Ambulatory Visit: Payer: Self-pay | Admitting: Obstetrics and Gynecology

## 2018-02-05 DIAGNOSIS — Z1231 Encounter for screening mammogram for malignant neoplasm of breast: Secondary | ICD-10-CM

## 2018-02-09 ENCOUNTER — Ambulatory Visit
Admission: RE | Admit: 2018-02-09 | Discharge: 2018-02-09 | Disposition: A | Discharge status: Home / Self Care | Payer: Managed Care, Other (non HMO) | Source: Ambulatory Visit | Attending: Obstetrics and Gynecology | Admitting: Obstetrics and Gynecology

## 2018-02-09 DIAGNOSIS — Z1231 Encounter for screening mammogram for malignant neoplasm of breast: Secondary | ICD-10-CM

## 2018-02-10 ENCOUNTER — Other Ambulatory Visit: Payer: Managed Care, Other (non HMO)

## 2018-04-14 DIAGNOSIS — G8929 Other chronic pain: Secondary | ICD-10-CM

## 2018-04-14 HISTORY — DX: Other chronic pain: G89.29

## 2018-05-05 ENCOUNTER — Telehealth: Payer: Self-pay | Admitting: Family Medicine

## 2018-05-05 ENCOUNTER — Ambulatory Visit (INDEPENDENT_AMBULATORY_CARE_PROVIDER_SITE_OTHER): Payer: Managed Care, Other (non HMO) | Admitting: Family Medicine

## 2018-05-05 ENCOUNTER — Encounter: Payer: Self-pay | Admitting: Family Medicine

## 2018-05-05 ENCOUNTER — Other Ambulatory Visit: Payer: Self-pay

## 2018-05-05 VITALS — BP 98/62 | HR 75 | Temp 98.1°F | Ht 67.0 in | Wt 113.4 lb

## 2018-05-05 DIAGNOSIS — M549 Dorsalgia, unspecified: Secondary | ICD-10-CM | POA: Diagnosis not present

## 2018-05-05 DIAGNOSIS — D509 Iron deficiency anemia, unspecified: Secondary | ICD-10-CM | POA: Diagnosis not present

## 2018-05-05 DIAGNOSIS — G43109 Migraine with aura, not intractable, without status migrainosus: Secondary | ICD-10-CM | POA: Diagnosis not present

## 2018-05-05 MED ORDER — SUMATRIPTAN SUCCINATE 100 MG PO TABS: 100.0000 mg | ORAL_TABLET | ORAL | 3 refills | Status: DC | PRN

## 2018-05-05 MED ORDER — FERROUS SULFATE DRIED ER 160 (50 FE) MG PO TBCR: 160.0000 mg | EXTENDED_RELEASE_TABLET | Freq: Every day | ORAL | 5 refills | Status: DC

## 2018-05-05 MED ORDER — CYCLOBENZAPRINE HCL 5 MG PO TABS: 5.0000 mg | ORAL_TABLET | Freq: Three times a day (TID) | ORAL | 5 refills | Status: DC | PRN

## 2018-05-05 NOTE — Telephone Encounter (Signed)
Imitrex 100 mg instructions - should be take one at onset of migraine and may repeat one in two hours as needed (max of 2 in 24 hours).

## 2018-05-05 NOTE — Telephone Encounter (Signed)
Please see message. °

## 2018-05-05 NOTE — Telephone Encounter (Signed)
Copied from [place] [id number]. Topic: Quick Communication - See Telephone Encounter >> May 05, 2018  3:35 PM Ivar Drape wrote: CRM for notification. See Telephone encounter for: 05/05/18. [facility] w/[facility] [phone] stated the patient's SUMAtriptan IMITREX 100 MG tablet medication's instructions says to take every two hours as needed for pain.  Pharmacist said the medication limit is no more than 2 a day.  Please advise.

## 2018-05-05 NOTE — Patient Instructions (Signed)

## 2018-05-05 NOTE — Progress Notes (Signed)
Subjective:     Patient ID: [name], female   DOB: November 17, 1970, 48 y.o.   MRN: [id number]  HPI Patient is seen for the following issues  Concern for migraine headache.  She is had infrequent migraines in the past.  She states her migraines are frequently bifrontal.  Her current headache started yesterday morning and has persisted.  This is left retro-orbital and she has some mild throbbing and mild nausea without vomiting and mild photosensitivity.  Qualitatively similar to previous migraines.  She took some ibuprofen without relief.  Ibuprofen generally relieves her migraines.  She has never taken triptan's in the past.  She tried some caffeine without relief.  Current headache is moderate.  She has not had any new symptoms such as visual changes-other than some visual aura symptoms which she has had with previous migraines.  No focal weakness or any other focal neurologic symptoms  She did get recent promotion at [employer] and her sleep patterns are somewhat different.  She is not aware of previous dietary triggers for migraines.  She has not had any consistent pattern of menstrual migraine  Long history of iron deficiency anemia.  She is had full endoscopic work-up previously.  Presumably menstrual.  She has been intolerant of over-the-counter iron sulfate.  She has frequent stomach upset with that.  Her last ferritin recently was 4.4.  Recent hemoglobin 11.3  She has had some chronic upper back pains and we prescribed low-dose Flexeril which has helped tremendously.  She has gotten occasional massage was also seems to help.  Requesting refills of Flexeril  Past Medical History:  Diagnosis Date  . 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 FATIGUE 05/04/2009  . Headache(784.0)   . HYPOKALEMIA 10/17/2009  . LOW BLOOD PRESSURE 10/17/2009  . NIGHT SWEATS 05/04/2009  . Polydipsia 05/04/2009  . SHINGLES, HX OF 11/24/2008  . SINUSITIS, ACUTE 03/07/2009  . [place] SHOULDER REGION 11/09/2008   Past  Surgical History:  Procedure Laterality Date  . EXTERNAL AUDITORY CANAL RECONSTRUCTION     tubes as a [place]  . TUBAL LIGATION  2007    reports that she has never smoked. She has never used smokeless tobacco. No history on file for alcohol and drug. family history includes Arthritis in an other family member; Cancer in an other family member; Diabetes in an other family member; Hyperlipidemia in an other family member; Hypertension in an other family member. No Known Allergies   Review of Systems  Constitutional: Positive for fatigue. Negative for appetite change and unexpected weight change.  Eyes: Negative for visual disturbance.  Respiratory: Negative for shortness of breath.   Gastrointestinal: Negative for abdominal pain.  Neurological: Positive for headaches. Negative for seizures, syncope, speech difficulty and weakness.       Objective:   Physical Exam Constitutional:      Appearance: Normal appearance.  HENT:     Head: Normocephalic and atraumatic.  Eyes:     Pupils: Pupils are equal, round, and reactive to light.  Neck:     Musculoskeletal: Neck supple.  Cardiovascular:     Rate and Rhythm: Normal rate and regular rhythm.     Heart sounds: Normal heart sounds. No murmur.  Pulmonary:     Effort: Pulmonary effort is normal.     Breath sounds: Normal breath sounds.  Lymphadenopathy:     Cervical: No cervical adenopathy.  Neurological:     General: No focal deficit present.     Mental Status: She is alert and oriented  to person, place, and time.     Cranial Nerves: No cranial nerve deficit.     Motor: No weakness.  Psychiatric:        Mood and Affect: Mood normal.        Thought Content: Thought content normal.        Assessment:     #1 acute unilateral headache.  She has several features typical of migraine headache.  Unclear trigger.  #2 chronic iron deficiency anemia.  She has been very intolerant of iron in the past  #3 chronic frequent upper back  pain-improved with low-dose Flexeril and massage therapy    Plan:     -Recommend trial of Imitrex 100 mg 1 at onset of migraine and may repeat 1 to 2 hours but no more than 2 in 24 hours -Refilled Flexeril which has helped at low dosage for her chronic neck and upper back pains -Try slow iron 160 mg 1 daily -Follow-up in 3 months and repeat CBC and ferritin then -We had a long discussion regarding potential triggers for migraine.  We discussed importance of early prompt medical treatment to try to abort migraine early on  Eulas Post MD [facility] at [facility]

## 2018-05-06 NOTE — Telephone Encounter (Signed)
Spoke with Habiness at [facility] and informed her that instructions should read take one at onset of migraine and may repeat one in two hours as (max of 2 in 24 hours) per Dr. Elease Hashimoto. Habiness verified by repeating instructions back to RN and reports she will update instructions on rx.

## 2018-08-31 ENCOUNTER — Encounter: Payer: Self-pay | Admitting: Family Medicine

## 2018-08-31 ENCOUNTER — Ambulatory Visit (INDEPENDENT_AMBULATORY_CARE_PROVIDER_SITE_OTHER): Payer: Managed Care, Other (non HMO) | Admitting: Family Medicine

## 2018-08-31 ENCOUNTER — Encounter: Payer: Self-pay | Admitting: *Deleted

## 2018-08-31 ENCOUNTER — Telehealth: Payer: Self-pay

## 2018-08-31 DIAGNOSIS — J029 Acute pharyngitis, unspecified: Secondary | ICD-10-CM | POA: Diagnosis not present

## 2018-08-31 DIAGNOSIS — R51 Headache: Secondary | ICD-10-CM

## 2018-08-31 DIAGNOSIS — R519 Headache, unspecified: Secondary | ICD-10-CM

## 2018-08-31 LAB — POCT RAPID STREP A (OFFICE): Rapid Strep A Screen: NEGATIVE

## 2018-08-31 MED ORDER — MAGIC MOUTHWASH W/LIDOCAINE: 5.0000 mL | Freq: Three times a day (TID) | ORAL | 0 refills | Status: DC | PRN

## 2018-08-31 NOTE — Telephone Encounter (Signed)
[facility] called to let us know that the Magic Mouthwash is no longer available from these pharmacies. They advised that this can be sent to [facility]. Dr. Doug Sou CMA has been advised.

## 2018-08-31 NOTE — Progress Notes (Signed)
Virtual Visit via Telephone Note  I connected with [name] on 08/31/18 at  2:45 PM EDT by telephone and verified that I am speaking with the correct person using two identifiers.   I discussed the limitations, risks, security and privacy concerns of performing an evaluation and management service by telephone and the availability of in person appointments. I also discussed with the patient that there may be a patient responsible charge related to this service. She expressed understanding and agreed to proceed.  Location patient: home Location provider: home office Participants present for the call: patient, provider Patient did not have a visit in the prior 7 days to address this/these issue(s).   History of Present Illness: [name] is a 48 yo female with Hx of migraines,iron def anemia,and fatigue among some;who is c/o headache and sore throat,both noted this am around 3:45 am. She has Hx of migraine headache but states that this is "the worse I ever had." Constant,bi-temporal throbbing headache,9/10. She has taken 4 Ibuprofen today but has not helped. Nausea for a week,intermittent. She took Dramamine ,which helped.  Mild photophobia.  She is not sure how often she gets migraines. She has not identified exacerbating or alleviating factors.   No associated visual changes,numbness,tingling ,or focal/neurologic deficit.  She has not noted fever,chill,unusual fatigue,chest pain,cough,dyspnea,wheezing,abdominal pain,vomiting,changes in bowel habits,or urinary symptoms.  Sore throat is "killing me." She denies sick contact or recent travel. Denies anosmia or ageustia. No known contact with covid 19 infected person.  Denies stridor or dysphagia. + Throat erythema but has not noted exudate. She has tried OTC cough drops but has no cough.     Observations/Objective: Patient sounds cheerful and well on the phone. I do not appreciate any SOB. Speech and thought processing  are grossly intact.+ Anxious. Patient reported vitals:N/A  Assessment and Plan:  1. Headache, unspecified headache type She reports that this headache is more intense than usual,no new associated symptom; even though she describes her headache as "very bad", she does not feel that she needs to go to the ER. Recommend resting in a dark room.  She was clearly instructed about warning signs.  2. Sore throat She is requesting strep test. We discussed possible causes of sore throat, could be viral. Recommend monitoring for new symptoms. Symptomatic treatment with mouth wash with lidocaine gargles.  I also gave her information about COVID-19 testing,phone # for her to call and arrange appt given.  - POC Rapid Strep A - Culture, Group A Strep   We will arrange rapid strep.  Follow Up Instructions:  F/U with PCP prn.  I did not refer this patient for an OV in the next 24 hours for this/these issue(s).  I discussed the assessment and treatment plan with the patient. She was provided an opportunity to ask questions and all were answered. The patient agreed with the plan and demonstrated an understanding of the instructions.   The patient was advised to call back or seek an in-person evaluation if the symptoms worsen or if the condition fails to improve as anticipated.  I provided 23 minutes of non-face-to-face time during this encounter.   Judith Martinique, MD

## 2018-09-02 LAB — CULTURE, GROUP A STREP
MICRO NUMBER:: 487568
SPECIMEN QUALITY:: ADEQUATE

## 2018-09-03 ENCOUNTER — Other Ambulatory Visit: Payer: Self-pay | Admitting: Family Medicine

## 2018-09-03 ENCOUNTER — Telehealth: Payer: Self-pay | Admitting: Family Medicine

## 2018-09-03 ENCOUNTER — Encounter: Payer: Self-pay | Admitting: *Deleted

## 2018-09-03 MED ORDER — MAGIC MOUTHWASH W/LIDOCAINE: 5.0000 mL | Freq: Three times a day (TID) | ORAL | 0 refills | Status: AC | PRN

## 2018-09-03 MED ORDER — MAGIC MOUTHWASH W/LIDOCAINE: 5.0000 mL | Freq: Three times a day (TID) | ORAL | 0 refills | Status: DC | PRN

## 2018-09-03 NOTE — Telephone Encounter (Signed)
Patient is calling back stating she needs to pick up "out of work letter".  She knows office will be closing today and 4 and will not open back up until Tuesday.  I told patient that we do have to wait for doctor.  If anyone could call her back as soon as the doctor approves the letter.  Patient call back # [phone]

## 2018-09-03 NOTE — Telephone Encounter (Signed)
Letter completed. Patient informed that she may pick it up. Patient stated she is on her way.

## 2018-09-03 NOTE — Telephone Encounter (Signed)
Copied from [place] [id number]. Topic: Quick Communication - Rx Refill/Question >> Sep 03, 2018  9:45 AM Margot Ables wrote: Medication: magic mouthwash w/lidocaine SOLN - [facility] no longer does compounding so they are not able to fulfill the RX

## 2018-09-03 NOTE — Telephone Encounter (Signed)
Patient stated that she needs another note to write her out of work until her Delta test results come back. She stated that she spoke to [place] and she was going to get this ready for patient to pick up today. She wanted me to send a message so they would not forget.

## 2018-09-03 NOTE — Telephone Encounter (Signed)
Called patient and she is going to [place] to pick up magic mouthwash.

## 2018-12-31 ENCOUNTER — Other Ambulatory Visit: Payer: Self-pay | Admitting: Obstetrics and Gynecology

## 2018-12-31 DIAGNOSIS — Z1231 Encounter for screening mammogram for malignant neoplasm of breast: Secondary | ICD-10-CM

## 2019-01-12 ENCOUNTER — Encounter: Payer: Self-pay | Admitting: Family Medicine

## 2019-01-12 ENCOUNTER — Other Ambulatory Visit: Payer: Self-pay

## 2019-01-12 ENCOUNTER — Ambulatory Visit (INDEPENDENT_AMBULATORY_CARE_PROVIDER_SITE_OTHER): Payer: Managed Care, Other (non HMO) | Admitting: Family Medicine

## 2019-01-12 VITALS — BP 110/60 | HR 89 | Temp 98.2°F | Wt 116.7 lb

## 2019-01-12 DIAGNOSIS — M545 Low back pain, unspecified: Secondary | ICD-10-CM

## 2019-01-12 DIAGNOSIS — E559 Vitamin D deficiency, unspecified: Secondary | ICD-10-CM

## 2019-01-12 DIAGNOSIS — G8929 Other chronic pain: Secondary | ICD-10-CM

## 2019-01-12 DIAGNOSIS — M25511 Pain in right shoulder: Secondary | ICD-10-CM | POA: Diagnosis not present

## 2019-01-12 DIAGNOSIS — D509 Iron deficiency anemia, unspecified: Secondary | ICD-10-CM

## 2019-01-12 MED ORDER — DICLOFENAC SODIUM 1 % TD GEL: 4.0000 g | Freq: Four times a day (QID) | TRANSDERMAL | 1 refills | Status: DC

## 2019-01-12 NOTE — Progress Notes (Signed)
Subjective:     Patient ID: [name] [place], female   DOB: 1970-08-30, 48 y.o.   MRN: [id number]  HPI [name] is seen today for several concerns:  Right shoulder pain.  Intermittent for several years.  She works for [employer] and does some lifting and thinks she might have exacerbated with that but no specific injury.  Has had steroid injections in past with temporary relief.  She states she had MRI years ago and was told she had a "small tear" but surgery not recommended.  Progressive pain over past few weeks.  Increased night pain.  Low back pain past two weeks.  Bilateral lumbar.  Achy quality.  No radiculitis symptoms.  Worse with back flexion.  Used some flexeril with mild relief  Hx of low Vit D.  Requesting repeat levels.  Has also had low iron.  Extensive GI workup in past with endoscopy and CT.  Intolerant of many forms of iron.  Past Medical History:  Diagnosis Date  . 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 FATIGUE 05/04/2009  . Headache(784.0)   . HYPOKALEMIA 10/17/2009  . LOW BLOOD PRESSURE 10/17/2009  . NIGHT SWEATS 05/04/2009  . Polydipsia 05/04/2009  . SHINGLES, HX OF 11/24/2008  . SINUSITIS, ACUTE 03/07/2009  . [place] SHOULDER REGION 11/09/2008   Past Surgical History:  Procedure Laterality Date  . EXTERNAL AUDITORY CANAL RECONSTRUCTION     tubes as a [place]  . TUBAL LIGATION  2007    reports that she has never smoked. She has never used smokeless tobacco. No history on file for alcohol and drug. family history includes Arthritis in an other family member; Cancer in an other family member; Diabetes in an other family member; Hyperlipidemia in an other family member; Hypertension in an other family member. No Known Allergies   Review of Systems  Constitutional: Negative for chills and fever.  HENT: Negative for trouble swallowing.   Respiratory: Negative for cough.   Cardiovascular: Negative for chest pain, palpitations and leg swelling.  Gastrointestinal: Negative for  abdominal pain.  Musculoskeletal: Positive for back pain.  Neurological: Negative for dizziness, weakness and numbness.       Objective:   Physical Exam Constitutional:      Appearance: Normal appearance.  Neck:     Musculoskeletal: Neck supple.  Cardiovascular:     Rate and Rhythm: Normal rate and regular rhythm.  Pulmonary:     Effort: Pulmonary effort is normal.     Breath sounds: Normal breath sounds.  Musculoskeletal:     Right lower leg: No edema.     Left lower leg: No edema.     Comments: SLRs negative.  Right shoulder full ROM.  No localized tenderness.  Pain with abduction.  Lymphadenopathy:     Cervical: No cervical adenopathy.  Neurological:     General: No focal deficit present.     Mental Status: She is alert.     Comments: Full strength lower extremities.  2 plus reflex knee and ankle bilaterally.        Assessment:     #1 Chronic progressive right shoulder pain  #2 Acute lumbar back pain- suspect muscular  #3 Vit D deficiency  #4 hx of iron deficiency.  Past GI work up unrevealing of cause.     Plan:     - check 25 OH Vit d, ferritin, CBC -reviewed extension stretches for back -Diclofenac 1% gel to right shoulder tid-qid -set up sports medicine referral to evaluate right shoulder pain.  Darnell Level  Dan Europe MD [facility] at [facility]

## 2019-01-12 NOTE — Patient Instructions (Signed)
Acute Back Pain, Adult Acute back pain is sudden and usually short-lived. It is often caused by an injury to the muscles and tissues in the back. The injury may result from:  A muscle or ligament getting overstretched or torn (strained). Ligaments are tissues that connect bones to each other. Lifting something improperly can cause a back strain.  Wear and tear (degeneration) of the spinal disks. Spinal disks are circular tissue that provides cushioning between the bones of the spine (vertebrae).  Twisting motions, such as while playing sports or doing yard work.  A hit to the back.  Arthritis. You may have a physical exam, lab tests, and imaging tests to find the cause of your pain. Acute back pain usually goes away with rest and home care. Follow these instructions at home: Managing pain, stiffness, and swelling  Take over-the-counter and prescription medicines only as told by your health care provider.  Your health care provider may recommend applying ice during the first 24-48 hours after your pain starts. To do this: ? Put ice in a plastic bag. ? Place a towel between your skin and the bag. ? Leave the ice on for 20 minutes, 2-3 times a day.  If directed, apply heat to the affected area as often as told by your health care provider. Use the heat source that your health care provider recommends, such as a moist heat pack or a heating pad. ? Place a towel between your skin and the heat source. ? Leave the heat on for 20-30 minutes. ? Remove the heat if your skin turns bright red. This is especially important if you are unable to feel pain, heat, or cold. You have a greater risk of getting burned. Activity   Do not stay in bed. Staying in bed for more than 1-2 days can delay your recovery.  Sit up and stand up straight. Avoid leaning forward when you sit, or hunching over when you stand. ? If you work at a desk, sit close to it so you do not need to lean over. Keep your chin tucked  in. Keep your neck drawn back, and keep your elbows bent at a right angle. Your arms should look like the letter "L." ? Sit high and close to the steering wheel when you drive. Add lower back (lumbar) support to your car seat, if needed.  Take short walks on even surfaces as soon as you are able. Try to increase the length of time you walk each day.  Do not sit, drive, or stand in one place for more than 30 minutes at a time. Sitting or standing for long periods of time can put stress on your back.  Do not drive or use heavy machinery while taking prescription pain medicine.  Use proper lifting techniques. When you bend and lift, use positions that put less stress on your back: ? Bend your knees. ? Keep the load close to your body. ? Avoid twisting.  Exercise regularly as told by your health care provider. Exercising helps your back heal faster and helps prevent back injuries by keeping muscles strong and flexible.  Work with a physical therapist to make a safe exercise program, as recommended by your health care provider. Do any exercises as told by your physical therapist. Lifestyle  Maintain a healthy weight. Extra weight puts stress on your back and makes it difficult to have good posture.  Avoid activities or situations that make you feel anxious or stressed. Stress and anxiety increase muscle   tension and can make back pain worse. Learn ways to manage anxiety and stress, such as through exercise. General instructions  Sleep on a firm mattress in a comfortable position. Try lying on your side with your knees slightly bent. If you lie on your back, put a pillow under your knees.  Follow your treatment plan as told by your health care provider. This may include: ? Cognitive or behavioral therapy. ? Acupuncture or massage therapy. ? Meditation or yoga. Contact a health care provider if:  You have pain that is not relieved with rest or medicine.  You have increasing pain going down  into your legs or buttocks.  Your pain does not improve after 2 weeks.  You have pain at night.  You lose weight without trying.  You have a fever or chills. Get help right away if:  You develop new bowel or bladder control problems.  You have unusual weakness or numbness in your arms or legs.  You develop nausea or vomiting.  You develop abdominal pain.  You feel faint. Summary  Acute back pain is sudden and usually short-lived.  Use proper lifting techniques. When you bend and lift, use positions that put less stress on your back.  Take over-the-counter and prescription medicines and apply heat or ice as directed by your health care provider. This information is not intended to replace advice given to you by your health care provider. Make sure you discuss any questions you have with your health care provider. Document Released: 03/31/2005 Document Revised: 07/20/2018 Document Reviewed: 11/12/2016 Elsevier Patient Education  2020 Elsevier Inc.  

## 2019-01-13 LAB — CBC WITH DIFFERENTIAL/PLATELET
Basophils Absolute: 0 10*3/uL (ref 0.0–0.1)
Basophils Relative: 0.2 % (ref 0.0–3.0)
Eosinophils Absolute: 0.1 10*3/uL (ref 0.0–0.7)
Eosinophils Relative: 1.9 % (ref 0.0–5.0)
HCT: 34.6 % — ABNORMAL LOW (ref 36.0–46.0)
Hemoglobin: 11.5 g/dL — ABNORMAL LOW (ref 12.0–15.0)
Lymphocytes Relative: 38.1 % (ref 12.0–46.0)
Lymphs Abs: 2.3 10*3/uL (ref 0.7–4.0)
MCHC: 33.3 g/dL (ref 30.0–36.0)
MCV: 94.3 fl (ref 78.0–100.0)
Monocytes Absolute: 0.4 10*3/uL (ref 0.1–1.0)
Monocytes Relative: 7.4 % (ref 3.0–12.0)
Neutro Abs: 3.1 10*3/uL (ref 1.4–7.7)
Neutrophils Relative %: 52.4 % (ref 43.0–77.0)
Platelets: 281 10*3/uL (ref 150.0–400.0)
RBC: 3.67 Mil/uL — ABNORMAL LOW (ref 3.87–5.11)
RDW: 13.4 % (ref 11.5–15.5)
WBC: 6 10*3/uL (ref 4.0–10.5)

## 2019-01-13 LAB — FERRITIN: Ferritin: 4.8 ng/mL — ABNORMAL LOW (ref 10.0–291.0)

## 2019-01-13 LAB — VITAMIN D 25 HYDROXY (VIT D DEFICIENCY, FRACTURES): VITD: 24.43 ng/mL — ABNORMAL LOW (ref 30.00–100.00)

## 2019-01-18 ENCOUNTER — Telehealth: Payer: Self-pay | Admitting: *Deleted

## 2019-01-18 NOTE — Telephone Encounter (Signed)
Please advise 

## 2019-01-18 NOTE — Telephone Encounter (Signed)
Referral has been made to sports medicine and she should be getting a call soon.  We can go ahead and start vitamin D 50,000 international units once weekly #12 with 1 refill

## 2019-01-18 NOTE — Telephone Encounter (Signed)
Pt request a call back regarding wanting to take Vitamin D 5,000 IU to help with her low vitamin D.  She is also concerned that she has not heard back from the orthopedist and is still in pain with her shoulder.

## 2019-01-19 ENCOUNTER — Other Ambulatory Visit: Payer: Self-pay | Admitting: *Deleted

## 2019-01-19 DIAGNOSIS — E559 Vitamin D deficiency, unspecified: Secondary | ICD-10-CM

## 2019-01-19 MED ORDER — VITAMIN D (ERGOCALCIFEROL) 1.25 MG (50000 UNIT) PO CAPS: 50000.0000 [IU] | ORAL_CAPSULE | ORAL | 1 refills | Status: DC

## 2019-01-19 NOTE — Telephone Encounter (Signed)
Rx sent to [facility]. LVM for patient that referral was sent and rx sent.

## 2019-01-24 ENCOUNTER — Telehealth: Payer: Self-pay

## 2019-01-24 NOTE — Telephone Encounter (Signed)
Called to schedule new patient appointment. Left message to call back.

## 2019-01-24 NOTE — Telephone Encounter (Signed)
Copied from [place] [id number]. Topic: General - Other >> Jan 24, 2019  2:08 PM Celene Kras A wrote: Reason for CRM: Pt called stating the orthopedic doctor just called her today to schedule her appt. Pt states she is needing the doctors note extended saying she cant lift more than five lbs. Please advise.

## 2019-01-25 ENCOUNTER — Telehealth: Payer: Self-pay

## 2019-01-25 ENCOUNTER — Encounter: Payer: Self-pay | Admitting: Family Medicine

## 2019-01-25 NOTE — Telephone Encounter (Signed)
Scheduled new patient appointment with Dr. Tamala Julian.

## 2019-01-25 NOTE — Telephone Encounter (Signed)
Patient calling to check status of work note extension. States that she is not able to see sports medicine until 02/09/2019 at 1:45 and the letter that she currently has only goes to 01/27/2019. Please advise.

## 2019-01-25 NOTE — Telephone Encounter (Signed)
Letter done.  She should be able to get this through [place].

## 2019-01-25 NOTE — Telephone Encounter (Signed)
Returned patient call. Left a message.

## 2019-01-26 NOTE — Telephone Encounter (Signed)
Attempted to reach pt no answer. Left vm that paperwork was placed up front and that she should call office to be cleared to come in building

## 2019-01-26 NOTE — Telephone Encounter (Signed)
New letter was placed up front. Attempted to reach pt this morning, no answer. Will check back with pt

## 2019-01-27 ENCOUNTER — Telehealth: Payer: Self-pay

## 2019-01-27 NOTE — Telephone Encounter (Signed)
Called to inform patient about openings on Monday.

## 2019-02-09 ENCOUNTER — Ambulatory Visit (INDEPENDENT_AMBULATORY_CARE_PROVIDER_SITE_OTHER): Payer: Managed Care, Other (non HMO) | Admitting: Family Medicine

## 2019-02-09 ENCOUNTER — Encounter: Payer: Self-pay | Admitting: Family Medicine

## 2019-02-09 ENCOUNTER — Other Ambulatory Visit: Payer: Self-pay

## 2019-02-09 ENCOUNTER — Ambulatory Visit: Payer: Self-pay

## 2019-02-09 ENCOUNTER — Ambulatory Visit (INDEPENDENT_AMBULATORY_CARE_PROVIDER_SITE_OTHER)
Admission: RE | Admit: 2019-02-09 | Discharge: 2019-02-09 | Disposition: A | Discharge status: Home / Self Care | Payer: Managed Care, Other (non HMO) | Source: Ambulatory Visit | Attending: Family Medicine | Admitting: Family Medicine

## 2019-02-09 VITALS — BP 110/60 | HR 76 | Ht 67.0 in | Wt 113.0 lb

## 2019-02-09 DIAGNOSIS — G8929 Other chronic pain: Secondary | ICD-10-CM

## 2019-02-09 DIAGNOSIS — M25511 Pain in right shoulder: Secondary | ICD-10-CM

## 2019-02-09 MED ORDER — VITAMIN D (ERGOCALCIFEROL) 1.25 MG (50000 UNIT) PO CAPS: 50000.0000 [IU] | ORAL_CAPSULE | ORAL | 3 refills | Status: DC

## 2019-02-09 MED ORDER — DICLOFENAC SODIUM 2 % TD SOLN: 2.0000 g | Freq: Two times a day (BID) | TRANSDERMAL | 3 refills | Status: DC

## 2019-02-09 NOTE — Addendum Note (Signed)
Addended by: Agnes Lawrence on: 02/09/2019 03:42 PM   Modules accepted: Orders

## 2019-02-09 NOTE — Assessment & Plan Note (Signed)
Right shoulder.  His last alignment chronic issue.  On ultrasound today rotator cuff does have some degenerative changes and partial tear of the subscapularis noted.  Patient also has what appears to be a stress reaction of the distal clavicle.  Mild swelling of the acromioclavicular joint.  X-rays ordered today for further evaluation with patient having a breast mass and further work-up is occurring.  Topical anti-inflammatories prescribed and would like to avoid oral secondary to patient's also having difficulty with her stomach recently.  Follow-up with me again 4 weeks after being on light duty at work.  See how patient is responding at that time.

## 2019-02-09 NOTE — Patient Instructions (Signed)
Good to see you.  Ice 20 minutes 2 times daily. Usually after activity and before bed. Tylenol 3x a day pennsaid pinkie amount topically 2 times daily as needed.  Once weekly vitamin D for 12 weeks.  K2 daily 232mcg daily See me again in 4-5 weeks

## 2019-02-09 NOTE — Progress Notes (Signed)
[name] [place], [place] [address] Phone: [phone] Subjective:   I [name] am serving as a Education administrator for Dr. Hulan Saas.  I'm seeing this patient by the request  of:  Eulas Post, MD   CC: Right shoulder pain  [id number]  [name] is a 48 y.o. female coming in with complaint of right shoulder pain. Works for [employer]. States she has had a torn rotator cuff about 8 years ago. Pain has gotten worse since then. Pain radiates down her arm. Patient states she is iron deficient. Getting Iron infusion and is also vitamin D deficient. Is taking 50,000 IU. Mass in right breast. Getting new breast scans on Tuesday. Lower back pain as well. Pain has been so bad she can barely move. Doesn't usually have back pain.   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  Past Medical History:  Diagnosis Date  . 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 FATIGUE 05/04/2009  . Headache(784.0)   . HYPOKALEMIA 10/17/2009  . LOW BLOOD PRESSURE 10/17/2009  . NIGHT SWEATS 05/04/2009  . Polydipsia 05/04/2009  . SHINGLES, HX OF 11/24/2008  . SINUSITIS, ACUTE 03/07/2009  . [place] SHOULDER REGION 11/09/2008   Past Surgical History:  Procedure Laterality Date  . EXTERNAL AUDITORY CANAL RECONSTRUCTION     tubes as a [name]  . TUBAL LIGATION  2007   Social History   Socioeconomic History  . Marital status: Divorced    Spouse name: Not on file  . Number of children: Not on file  . Years of education: Not on file  . Highest education level: Not on file  Occupational History  . Not on file  Social Needs  . Financial  resource strain: Not on file  . Food insecurity    Worry: Not on file    Inability: Not on file  . Transportation needs    Medical: Not on file    Non-medical: Not on file  Tobacco Use  . Smoking status: Never Smoker  . Smokeless tobacco: Never Used  Substance and Sexual Activity  . Alcohol use: Not on file  . Drug use: Not on file  . Sexual activity: Not on file  Lifestyle  . Physical activity    Days per week: Not on file    Minutes per session: Not on file  . Stress: Not on file  Relationships  . Social Herbalist on phone: Not on file    Gets together: Not on file    Attends religious service: Not on file    Active member of club or organization: Not on file    Attends meetings of clubs or organizations: Not on file    Relationship status: Not on file  Other Topics Concern  . Not on file  Social History Narrative   Works for [employer] but mostly lives by herself   No Known Allergies Family History  Problem Relation Age of Onset  . Arthritis Other   . Hyperlipidemia Other   . Hypertension Other   . Cancer Other  lung  . Diabetes Other       Current Outpatient Medications (Respiratory):  .  diphenhydrAMINE (BENADRYL) 25 MG tablet, Take 25 mg by mouth every 6 (six) hours as needed.  Current Outpatient Medications (Analgesics):  .  ibuprofen (ADVIL,MOTRIN) 200 MG tablet, Take 200 mg by mouth every 6 (six) hours as needed. .  SUMAtriptan (IMITREX) 100 MG tablet, Take 1 tablet (100 mg total) by mouth every 2 (two) hours as needed for migraine. May repeat in 2 hours if headache persists or recurs.  Current Outpatient Medications (Hematological):  .  ferrous sulfate (SLOW IRON) 160 (50 Fe) MG TBCR SR tablet, Take 1 tablet (160 mg total) by mouth daily.  Current Outpatient Medications (Other):  .  cyclobenzaprine (FLEXERIL) 5 MG tablet, Take 1 tablet (5 mg total) by mouth 3 (three) times daily as needed for muscle spasms. .   diclofenac sodium (VOLTAREN) 1 % GEL, Apply 4 g topically 4 (four) times daily. .  Vitamin D, Ergocalciferol, (DRISDOL) 1.25 MG (50000 UT) CAPS capsule, Take 1 capsule (50,000 Units total) by mouth every 7 (seven) days. .  Diclofenac Sodium 2 % SOLN, Place 2 g onto the skin 2 (two) times daily.    Past medical history, social, surgical and family history all reviewed in electronic medical record.  No pertanent information unless stated regarding to the chief complaint.   Review of Systems:  No headache, visual changes, nausea, vomiting, diarrhea, constipation, dizziness, abdominal pain, skin rash, fevers, chills, night sweats, weight loss, swollen lymph nodes,chest pain, shortness of breath, mood changes.  Positive muscle aches, body aches  Objective  Blood pressure 110/60, pulse 76, height 5\' 7"  (1.702 m), weight 113 lb (51.3 kg), last menstrual period 02/06/2019, SpO2 98 %.    General: No apparent distress alert and oriented x3 mood and affect normal, dressed appropriately.  Patient is quite cachectic HEENT: Pupils equal, extraocular movements intact  Respiratory: Patient's speak in full sentences and does not appear short of breath  Cardiovascular: No lower extremity edema, non tender, no erythema  Skin: Warm dry intact with no signs of infection or rash on extremities or on axial skeleton.  Abdomen: Soft nontender  Neuro: Cranial nerves II through XII are intact, neurovascularly intact in all extremities with 2+ DTRs and 2+ pulses.  Lymph: No lymphadenopathy of posterior or anterior cervical chain or axillae bilaterally.  Gait normal with good balance and coordination.  MSK:  Non tender with full range of motion and good stability and symmetric strength and tone of , elbows, wrist, hip, knee and ankles bilaterally.  Right shoulder exam does have some atrophy of the musculature but it does seem to be symmetric to the contralateral side.  No masses appreciated in this area.  Patient does  have tenderness to palpation diffusely around the shoulder.  Positive impingement. Rotator cuff strength 4+ out of 5 but symmetric to the contralateral side.  Limited musculoskeletal ultrasound was performed and interpreted by Lyndal Pulley  Limited ultrasound of patient's right shoulder shows that patient does have a very small partial tear low-grade minimal retraction of the subscapularis, some mild chronic changes of the supraspinatus tendon possible tearing noted.  Thinning of the bone for patient's age.  Patient has what appears to be a stress reaction or stress fracture of the distal clavicle noted. Impression: Rotator cuff pathology with stress reaction of the distal clavicle    Impression and Recommendations:     This case required medical decision making of moderate  complexity. The above documentation has been reviewed and is accurate and complete Lyndal Pulley, DO       Note: This dictation was prepared with Dragon dictation along with smaller phrase technology. Any transcriptional errors that result from this process are unintentional.

## 2019-02-11 ENCOUNTER — Ambulatory Visit (HOSPITAL_COMMUNITY)
Admission: RE | Admit: 2019-02-11 | Discharge: 2019-02-11 | Disposition: A | Discharge status: Home / Self Care | Payer: Managed Care, Other (non HMO) | Source: Ambulatory Visit | Attending: Internal Medicine | Admitting: Internal Medicine

## 2019-02-11 ENCOUNTER — Other Ambulatory Visit: Payer: Self-pay

## 2019-02-11 DIAGNOSIS — D509 Iron deficiency anemia, unspecified: Secondary | ICD-10-CM | POA: Insufficient documentation

## 2019-02-11 MED ORDER — SODIUM CHLORIDE 0.9 % IV SOLN
INTRAVENOUS | Status: DC | PRN
  Administered 2019-02-11: 250 mL via INTRAVENOUS

## 2019-02-11 MED ORDER — SODIUM CHLORIDE 0.9 % IV SOLN
510.0000 mg | Freq: Once | INTRAVENOUS | Status: AC
  Administered 2019-02-11: 510 mg via INTRAVENOUS
  Filled 2019-02-11: qty 17

## 2019-02-11 NOTE — Discharge Instructions (Signed)

## 2019-02-11 NOTE — Progress Notes (Signed)
PATIENT CARE CENTER NOTE  Diagnosis: Iron Deficiency Anemia D50.9   Provider: Carolann Littler, MD   Procedure: IV Feraheme    Note: Patient received Feraheme infusion via PIV. Tolerated well with no adverse reaction.  Observed patient for 30 minutes post-infusion. Vital signs stable. Discharge instructions given. Patient alert, oriented and ambulatory at discharge.

## 2019-02-15 ENCOUNTER — Other Ambulatory Visit: Payer: Self-pay

## 2019-02-15 ENCOUNTER — Ambulatory Visit
Admission: RE | Admit: 2019-02-15 | Discharge: 2019-02-15 | Disposition: A | Discharge status: Home / Self Care | Payer: Managed Care, Other (non HMO) | Source: Ambulatory Visit | Attending: Obstetrics and Gynecology | Admitting: Obstetrics and Gynecology

## 2019-02-15 DIAGNOSIS — Z1231 Encounter for screening mammogram for malignant neoplasm of breast: Secondary | ICD-10-CM

## 2019-02-17 ENCOUNTER — Other Ambulatory Visit: Payer: Self-pay | Admitting: Obstetrics and Gynecology

## 2019-02-17 DIAGNOSIS — R928 Other abnormal and inconclusive findings on diagnostic imaging of breast: Secondary | ICD-10-CM

## 2019-02-22 ENCOUNTER — Other Ambulatory Visit: Payer: Self-pay

## 2019-02-22 ENCOUNTER — Ambulatory Visit
Admission: RE | Admit: 2019-02-22 | Discharge: 2019-02-22 | Disposition: A | Discharge status: Home / Self Care | Payer: Managed Care, Other (non HMO) | Source: Ambulatory Visit | Attending: Obstetrics and Gynecology | Admitting: Obstetrics and Gynecology

## 2019-02-22 ENCOUNTER — Other Ambulatory Visit: Payer: Self-pay | Admitting: Obstetrics and Gynecology

## 2019-02-22 DIAGNOSIS — R928 Other abnormal and inconclusive findings on diagnostic imaging of breast: Secondary | ICD-10-CM

## 2019-02-22 DIAGNOSIS — N6320 Unspecified lump in the left breast, unspecified quadrant: Secondary | ICD-10-CM

## 2019-02-23 ENCOUNTER — Ambulatory Visit
Admission: RE | Admit: 2019-02-23 | Discharge: 2019-02-23 | Disposition: A | Discharge status: Home / Self Care | Payer: Managed Care, Other (non HMO) | Source: Ambulatory Visit | Attending: Obstetrics and Gynecology | Admitting: Obstetrics and Gynecology

## 2019-02-23 ENCOUNTER — Other Ambulatory Visit: Payer: Self-pay

## 2019-02-23 DIAGNOSIS — N6320 Unspecified lump in the left breast, unspecified quadrant: Secondary | ICD-10-CM

## 2019-03-03 NOTE — Progress Notes (Signed)
[name] [place] [place], [place] [address] Phone: [phone] Subjective:   I [name] am serving as a Education administrator for Dr. Hulan Saas.  This visit occurred during the SARS-CoV-2 public health emergency.  Safety protocols were in place, including screening questions prior to the visit, additional usage of staff PPE, and extensive cleaning of exam room while observing appropriate contact time as indicated for disinfecting solutions.     CC: Shoulder pain follow-up  [id number]   02/09/2019 Right shoulder.  His last alignment chronic issue.  On ultrasound today rotator cuff does have some degenerative changes and partial tear of the subscapularis noted.  Patient also has what appears to be a stress reaction of the distal clavicle.  Mild swelling of the acromioclavicular joint.  X-rays ordered today for further evaluation with patient having a breast mass and further work-up is occurring.  Topical anti-inflammatories prescribed and would like to avoid oral secondary to patient's also having difficulty with her stomach recently.  Follow-up with me again 4 weeks after being on light duty at work.  See how patient is responding at that time.  03/04/2019 [name] is a 48 y.o. female coming in with complaint of right shoulder pain. States she still has pain with lifting. Lifting her purse is even painful. Throbbing aching pain.  Patient states that has been doing the home exercises and topical anti-inflammatories but no significant improvement.  Has been lifting at work and notices that seems to cause more pain as well.    Past Medical History:  Diagnosis Date  . 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 FATIGUE 05/04/2009  . Headache(784.0)   . HYPOKALEMIA 10/17/2009  . LOW BLOOD PRESSURE 10/17/2009  . NIGHT SWEATS 05/04/2009  . Polydipsia 05/04/2009  . SHINGLES, HX OF 11/24/2008  . SINUSITIS, ACUTE 03/07/2009  . [place] SHOULDER REGION  11/09/2008   Past Surgical History:  Procedure Laterality Date  . EXTERNAL AUDITORY CANAL RECONSTRUCTION     tubes as a [place]  . TUBAL LIGATION  2007   Social History   Socioeconomic History  . Marital status: Divorced    Spouse name: Not on file  . Number of children: Not on file  . Years of education: Not on file  . Highest education level: Not on file  Occupational History  . Not on file  Social Needs  . Financial resource strain: Not on file  . Food insecurity    Worry: Not on file    Inability: Not on file  . Transportation needs    Medical: Not on file    Non-medical: Not on file  Tobacco Use  . Smoking status: Never Smoker  . Smokeless tobacco: Never Used  Substance and Sexual Activity  . Alcohol use: Not on file  . Drug use: Not on file  . Sexual activity: Not on file  Lifestyle  . Physical activity    Days per week: Not on file    Minutes per session: Not on file  . Stress: Not on file  Relationships  . Social Herbalist on phone: Not on file    Gets together: Not on file    Attends religious service: Not on file    Active member of club or organization: Not on file    Attends meetings of clubs or organizations: Not on file    Relationship status: Not on file  Other Topics Concern  . Not on file  Social History  Narrative   Works for [employer] but mostly lives by herself   No Known Allergies Family History  Problem Relation Age of Onset  . Arthritis Other   . Hyperlipidemia Other   . Hypertension Other   . Cancer Other        lung  . Diabetes Other       Current Outpatient Medications (Respiratory):  .  diphenhydrAMINE (BENADRYL) 25 MG tablet, Take 25 mg by mouth every 6 (six) hours as needed.  Current Outpatient Medications (Analgesics):  .  ibuprofen (ADVIL,MOTRIN) 200 MG tablet, Take 200 mg by mouth every 6 (six) hours as needed. .  SUMAtriptan (IMITREX) 100 MG tablet, Take 1 tablet (100 mg total) by mouth  every 2 (two) hours as needed for migraine. May repeat in 2 hours if headache persists or recurs.  Current Outpatient Medications (Hematological):  .  ferrous sulfate (SLOW IRON) 160 (50 Fe) MG TBCR SR tablet, Take 1 tablet (160 mg total) by mouth daily.  Current Outpatient Medications (Other):  .  cyclobenzaprine (FLEXERIL) 5 MG tablet, Take 1 tablet (5 mg total) by mouth 3 (three) times daily as needed for muscle spasms. .  diclofenac sodium (VOLTAREN) 1 % GEL, Apply 4 g topically 4 (four) times daily. .  Diclofenac Sodium 2 % SOLN, Place 2 g onto the skin 2 (two) times daily. .  Vitamin D, Ergocalciferol, (DRISDOL) 1.25 MG (50000 UT) CAPS capsule, Take 1 capsule (50,000 Units total) by mouth every 7 (seven) days. .  Vitamin D, Ergocalciferol, (DRISDOL) 1.25 MG (50000 UT) CAPS capsule, Take 1 capsule (50,000 Units total) by mouth every 7 (seven) days.    Past medical history, social, surgical and family history all reviewed in electronic medical record.  No pertanent information unless stated regarding to the chief complaint.   Review of Systems:  No headache, visual changes, nausea, vomiting, diarrhea, constipation, dizziness, abdominal pain, skin rash, fevers, chills, night sweats, weight loss, swollen lymph nodes, body aches, joint swelling,  chest pain, shortness of breath, mood changes.  Positive muscle aches  Objective  Blood pressure 100/60, pulse 91, height 5\' 7"  (1.702 m), weight 111 lb (50.3 kg), last menstrual period 02/06/2019, SpO2 98 %.    General: No apparent distress alert and oriented x3 mood and affect normal, dressed appropriately.  HEENT: Pupils equal, extraocular movements intact  Respiratory: Patient's speak in full sentences and does not appear short of breath  Cardiovascular: No lower extremity edema, non tender, no erythema  Skin: Warm dry intact with no signs of infection or rash on extremities or on axial skeleton.  Abdomen: Soft nontender  Neuro: Cranial  nerves II through XII are intact, neurovascularly intact in all extremities with 2+ DTRs and 2+ pulses.  Lymph: No lymphadenopathy of posterior or anterior cervical chain or axillae bilaterally.  Gait normal with good balance and coordination.  MSK:  tender with full range of motion and good stability and symmetric strength and tone of  elbows, wrist, hip, knee and ankles bilaterally.  Shoulder shows the patient does have some tender to palpation.  Patient is cachectic so atrophy noted of the shoulder joint.  Positive impingement.  Rotator cuff strength 4 out of 5 bilaterally weaker from previous exam.  Procedure: Real-time Ultrasound Guided Injection of right glenohumeral joint Device: GE Logiq Q7  Ultrasound guided injection is preferred based studies that show increased duration, increased effect, greater accuracy, decreased procedural pain, increased response rate with  ultrasound guided versus blind injection.  Verbal informed consent obtained.  Time-out conducted.  Noted no overlying erythema, induration, or other signs of local infection.  Skin prepped in a sterile fashion.  Local anesthesia: Topical Ethyl chloride.  With sterile technique and under real time ultrasound guidance:  Joint visualized.  23g 1  inch needle inserted posterior approach. Pictures taken for needle placement. Patient did have injection of 2 cc of 1% lidocaine, 2 cc of 0.5% Marcaine, and 1.0 cc of Kenalog 40 mg/dL. Completed without difficulty  Pain immediately resolved suggesting accurate placement of the medication.  Advised to call if fevers/chills, erythema, induration, drainage, or persistent bleeding.  Images permanently stored and available for review in the ultrasound unit.  Impression: Technically successful ultrasound guided injection.   Impression and Recommendations:     This case required medical decision making of moderate complexity. The above documentation has been reviewed and is accurate and  complete Lyndal Pulley, DO       Note: This dictation was prepared with Dragon dictation along with smaller phrase technology. Any transcriptional errors that result from this process are unintentional.

## 2019-03-04 ENCOUNTER — Other Ambulatory Visit: Payer: Self-pay

## 2019-03-04 ENCOUNTER — Ambulatory Visit (INDEPENDENT_AMBULATORY_CARE_PROVIDER_SITE_OTHER): Payer: Managed Care, Other (non HMO) | Admitting: Family Medicine

## 2019-03-04 ENCOUNTER — Encounter: Payer: Self-pay | Admitting: Family Medicine

## 2019-03-04 ENCOUNTER — Ambulatory Visit: Payer: Self-pay

## 2019-03-04 VITALS — BP 100/60 | HR 91 | Ht 67.0 in | Wt 111.0 lb

## 2019-03-04 DIAGNOSIS — G8929 Other chronic pain: Secondary | ICD-10-CM | POA: Diagnosis not present

## 2019-03-04 DIAGNOSIS — M25511 Pain in right shoulder: Secondary | ICD-10-CM | POA: Diagnosis not present

## 2019-03-04 NOTE — Patient Instructions (Addendum)
Good to see you See me again in 4 weeks 10 lbs lifting limit

## 2019-03-05 ENCOUNTER — Encounter: Payer: Self-pay | Admitting: Family Medicine

## 2019-03-05 NOTE — Assessment & Plan Note (Signed)
No improvement with conservative therapy at this time.  Have avoided formal physical therapy eval secondary to coronavirus outbreak.  Injection given today patient tolerated procedure well.  Discussed icing regimen.  Patient will be put on some light duty at work for the next 4 weeks and follow-up with me again in 4 to 8 weeks

## 2019-04-01 ENCOUNTER — Ambulatory Visit: Payer: Managed Care, Other (non HMO) | Admitting: Family Medicine

## 2019-04-01 NOTE — Progress Notes (Deleted)
[name] [name], [place] [address] Phone: [phone] Subjective:   [name], am serving as a scribe for Dr. Hulan Saas. This visit occurred during the SARS-CoV-2 public health emergency.  Safety protocols were in place, including screening questions prior to the visit, additional usage of staff PPE, and extensive cleaning of exam room while observing appropriate contact time as indicated for disinfecting solutions.   I'm seeing this patient by the request  of:    CC:   [id number]   03/04/2019 No improvement with conservative therapy at this time.  Have avoided formal physical therapy eval secondary to coronavirus outbreak.  Injection given today patient tolerated procedure well.  Discussed icing regimen.  Patient will be put on some light duty at work for the next 4 weeks and follow-up with me again in 4 to 8 weeks  Update 04/01/2019 [name] is a 48 y.o. female coming in with complaint of      Past Medical History:  Diagnosis Date  . 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 FATIGUE 05/04/2009  . Headache(784.0)   . HYPOKALEMIA 10/17/2009  . LOW BLOOD PRESSURE 10/17/2009  . NIGHT SWEATS 05/04/2009  . Polydipsia 05/04/2009  . SHINGLES, HX OF 11/24/2008  . SINUSITIS, ACUTE 03/07/2009  . [place] SHOULDER REGION 11/09/2008   Past Surgical History:  Procedure Laterality Date  . EXTERNAL AUDITORY CANAL RECONSTRUCTION     tubes as a [place]  . TUBAL LIGATION  2007   Social History   Socioeconomic History  . Marital status: Divorced    Spouse name: Not on file  . Number of children: Not on file  . Years of education: Not on file  . Highest education level: Not on file  Occupational History  . Not on file  Tobacco Use  . Smoking status: Never Smoker  . Smokeless tobacco: Never Used  Substance and Sexual Activity  . Alcohol use: Not on file  . Drug use: Not on file  . Sexual activity: Not on file  Other  Topics Concern  . Not on file  Social History Narrative   Works for [employer] but mostly lives by herself   Social Determinants of Radio broadcast assistant Strain:   . Difficulty of Paying Living Expenses: Not on file  Food Insecurity:   . Worried About Charity fundraiser in the Last Year: Not on file  . Ran Out of Food in the Last Year: Not on file  Transportation Needs:   . Lack of Transportation (Medical): Not on file  . Lack of Transportation (Non-Medical): Not on file  Physical Activity:   . Days of Exercise per Week: Not on file  . Minutes of Exercise per Session: Not on file  Stress:   . Feeling of Stress : Not on file  Social Connections:   . Frequency of Communication with Friends and Family: Not on file  . Frequency of Social Gatherings with Friends and Family: Not on file  . Attends Religious Services: Not on file  . Active Member of Clubs or Organizations: Not on file  . Attends Archivist Meetings: Not on file  . Marital Status: Not on file   No Known Allergies Family History  Problem Relation Age of Onset  . Arthritis Other   . Hyperlipidemia Other   . Hypertension Other   . Cancer Other  lung  . Diabetes Other       Current Outpatient Medications (Respiratory):  .  diphenhydrAMINE (BENADRYL) 25 MG tablet, Take 25 mg by mouth every 6 (six) hours as needed.  Current Outpatient Medications (Analgesics):  .  ibuprofen (ADVIL,MOTRIN) 200 MG tablet, Take 200 mg by mouth every 6 (six) hours as needed. .  SUMAtriptan (IMITREX) 100 MG tablet, Take 1 tablet (100 mg total) by mouth every 2 (two) hours as needed for migraine. May repeat in 2 hours if headache persists or recurs.  Current Outpatient Medications (Hematological):  .  ferrous sulfate (SLOW IRON) 160 (50 Fe) MG TBCR SR tablet, Take 1 tablet (160 mg total) by mouth daily.  Current Outpatient Medications (Other):  .  cyclobenzaprine (FLEXERIL) 5 MG tablet, Take 1  tablet (5 mg total) by mouth 3 (three) times daily as needed for muscle spasms. .  diclofenac sodium (VOLTAREN) 1 % GEL, Apply 4 g topically 4 (four) times daily. .  Diclofenac Sodium 2 % SOLN, Place 2 g onto the skin 2 (two) times daily. .  Vitamin D, Ergocalciferol, (DRISDOL) 1.25 MG (50000 UT) CAPS capsule, Take 1 capsule (50,000 Units total) by mouth every 7 (seven) days. .  Vitamin D, Ergocalciferol, (DRISDOL) 1.25 MG (50000 UT) CAPS capsule, Take 1 capsule (50,000 Units total) by mouth every 7 (seven) days.    Past medical history, social, surgical and family history all reviewed in electronic medical record.  No pertanent information unless stated regarding to the chief complaint.   Review of Systems:  No headache, visual changes, nausea, vomiting, diarrhea, constipation, dizziness, abdominal pain, skin rash, fevers, chills, night sweats, weight loss, swollen lymph nodes, body aches, joint swelling, muscle aches, chest pain, shortness of breath, mood changes.   Objective  There were no vitals taken for this visit. Systems examined below as of    General: No apparent distress alert and oriented x3 mood and affect normal, dressed appropriately.  HEENT: Pupils equal, extraocular movements intact  Respiratory: Patient's speak in full sentences and does not appear short of breath  Cardiovascular: No lower extremity edema, non tender, no erythema  Skin: Warm dry intact with no signs of infection or rash on extremities or on axial skeleton.  Abdomen: Soft nontender  Neuro: Cranial nerves II through XII are intact, neurovascularly intact in all extremities with 2+ DTRs and 2+ pulses.  Lymph: No lymphadenopathy of posterior or anterior cervical chain or axillae bilaterally.  Gait normal with good balance and coordination.  MSK:  Non tender with full range of motion and good stability and symmetric strength and tone of shoulders, elbows, wrist, hip, knee and ankles bilaterally.       Impression and Recommendations:     This case required medical decision making of moderate complexity. The above documentation has been reviewed and is accurate and complete Jacqualin Combes       Note: This dictation was prepared with Dragon dictation along with smaller phrase technology. Any transcriptional errors that result from this process are unintentional.

## 2019-04-15 DIAGNOSIS — D1601 Benign neoplasm of scapula and long bones of right upper limb: Secondary | ICD-10-CM

## 2019-04-15 HISTORY — PX: BREAST CYST ASPIRATION: SHX578

## 2019-04-15 HISTORY — DX: Benign neoplasm of scapula and long bones of right upper limb: D16.01

## 2019-04-20 ENCOUNTER — Ambulatory Visit: Payer: Managed Care, Other (non HMO) | Attending: Internal Medicine

## 2019-04-20 DIAGNOSIS — Z20822 Contact with and (suspected) exposure to covid-19: Secondary | ICD-10-CM

## 2019-04-21 LAB — NOVEL CORONAVIRUS, NAA: SARS-CoV-2, NAA: NOT DETECTED

## 2019-05-16 ENCOUNTER — Other Ambulatory Visit: Payer: Self-pay | Admitting: Obstetrics and Gynecology

## 2019-05-16 DIAGNOSIS — N6310 Unspecified lump in the right breast, unspecified quadrant: Secondary | ICD-10-CM

## 2019-05-16 DIAGNOSIS — U071 COVID-19: Secondary | ICD-10-CM

## 2019-05-16 HISTORY — DX: COVID-19: U07.1

## 2019-05-20 ENCOUNTER — Ambulatory Visit
Admission: RE | Admit: 2019-05-20 | Discharge: 2019-05-20 | Disposition: A | Discharge status: Home / Self Care | Payer: Managed Care, Other (non HMO) | Source: Ambulatory Visit | Attending: Obstetrics and Gynecology | Admitting: Obstetrics and Gynecology

## 2019-05-20 ENCOUNTER — Other Ambulatory Visit: Payer: Self-pay | Admitting: Obstetrics and Gynecology

## 2019-05-20 ENCOUNTER — Other Ambulatory Visit: Payer: Self-pay

## 2019-05-20 DIAGNOSIS — N6001 Solitary cyst of right breast: Secondary | ICD-10-CM

## 2019-05-20 DIAGNOSIS — N6310 Unspecified lump in the right breast, unspecified quadrant: Secondary | ICD-10-CM

## 2019-05-31 ENCOUNTER — Other Ambulatory Visit: Payer: Self-pay

## 2019-05-31 ENCOUNTER — Telehealth (INDEPENDENT_AMBULATORY_CARE_PROVIDER_SITE_OTHER): Payer: Managed Care, Other (non HMO) | Admitting: Family Medicine

## 2019-05-31 DIAGNOSIS — U071 COVID-19: Secondary | ICD-10-CM | POA: Diagnosis not present

## 2019-05-31 MED ORDER — BENZONATATE 100 MG PO CAPS: 100.0000 mg | ORAL_CAPSULE | Freq: Three times a day (TID) | ORAL | 0 refills | Status: DC | PRN

## 2019-05-31 NOTE — Progress Notes (Signed)
This visit type was conducted due to national recommendations for restrictions regarding the COVID-19 pandemic in an effort to limit this patient's exposure and mitigate transmission in our community.   Virtual Visit via Video Note  I connected with [name] on 05/31/19 at  1:30 PM EST by a video enabled telemedicine application and verified that I am speaking with the correct person using two identifiers.  Location patient: home Location provider:work or home office Persons participating in the virtual visit: patient, provider  I discussed the limitations of evaluation and management by telemedicine and the availability of in person appointments. The patient expressed understanding and agreed to proceed.   HPI:  [name] is seen with diagnosis of Covid.  She states that she had onset of symptoms Sunday rather acutely of headache, sore throat, cough.  She has had increased myalgias and fatigue.  She went yesterday for testing and came back positive.  She thinks her exposure was at work.  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  She has had increased cough especially at night and is taken NyQuil and Hall's cough drops without much improvement.  She has been using Motrin thus far for her fever and body aches and headaches.  No nausea or vomiting.  No diarrhea.  She does have some dyspnea with activity.  She denies any chronic lung issues or heart problems.  She has no other significant comorbidities.  She has ordered a pulse oximeter but has not yet monitoring her oxygen levels   ROS: See pertinent positives and negatives per HPI.  Past Medical History:  Diagnosis Date  . 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 FATIGUE 05/04/2009  . Headache(784.0)   . HYPOKALEMIA 10/17/2009  . LOW BLOOD PRESSURE 10/17/2009  . NIGHT SWEATS 05/04/2009  . Polydipsia 05/04/2009  . SHINGLES, HX OF 11/24/2008  . SINUSITIS, ACUTE 03/07/2009  . [place] SHOULDER REGION 11/09/2008    Past Surgical History:   Procedure Laterality Date  . EXTERNAL AUDITORY CANAL RECONSTRUCTION     tubes as a [place]  . TUBAL LIGATION  2007    Family History  Problem Relation Age of Onset  . Arthritis Other   . Hyperlipidemia Other   . Hypertension Other   . Cancer Other        lung  . Diabetes Other     SOCIAL HX: Non-smoker   Current Outpatient Medications:  .  benzonatate (TESSALON) 100 MG capsule, Take 1 capsule (100 mg total) by mouth 3 (three) times daily as needed for cough., Disp: 30 capsule, Rfl: 0 .  cyclobenzaprine (FLEXERIL) 5 MG tablet, Take 1 tablet (5 mg total) by mouth 3 (three) times daily as needed for muscle spasms., Disp: 30 tablet, Rfl: 5 .  diclofenac sodium (VOLTAREN) 1 % GEL, Apply 4 g topically 4 (four) times daily., Disp: 100 g, Rfl: 1 .  Diclofenac Sodium 2 % SOLN, Place 2 g onto the skin 2 (two) times daily., Disp: 112 g, Rfl: 3 .  diphenhydrAMINE (BENADRYL) 25 MG tablet, Take 25 mg by mouth every 6 (six) hours as needed., Disp: , Rfl:  .  ferrous sulfate (SLOW IRON) 160 (50 Fe) MG TBCR SR tablet, Take 1 tablet (160 mg total) by mouth daily., Disp: 30 each, Rfl: 5 .  ibuprofen (ADVIL,MOTRIN) 200 MG tablet, Take 200 mg by mouth every 6 (six) hours as needed., Disp: , Rfl:  .  SUMAtriptan (IMITREX) 100 MG tablet, Take 1 tablet (100 mg total) by mouth every 2 (two) hours  as needed for migraine. May repeat in 2 hours if headache persists or recurs., Disp: 10 tablet, Rfl: 3 .  Vitamin D, Ergocalciferol, (DRISDOL) 1.25 MG (50000 UT) CAPS capsule, Take 1 capsule (50,000 Units total) by mouth every 7 (seven) days., Disp: 12 capsule, Rfl: 1 .  Vitamin D, Ergocalciferol, (DRISDOL) 1.25 MG (50000 UT) CAPS capsule, Take 1 capsule (50,000 Units total) by mouth every 7 (seven) days., Disp: 12 capsule, Rfl: 3  EXAM:  VITALS per patient if applicable:  GENERAL: alert, oriented, appears well and in no acute distress  HEENT: atraumatic, conjunttiva clear, no obvious abnormalities on inspection  of external nose and ears  NECK: normal movements of the head and neck  LUNGS: on inspection no signs of respiratory distress, breathing rate appears normal, no obvious gross SOB, gasping or wheezing  CV: no obvious cyanosis  MS: moves all visible extremities without noticeable abnormality  PSYCH/NEURO: pleasant and cooperative, no obvious depression or anxiety, speech and thought processing grossly intact  ASSESSMENT AND PLAN:  Discussed the following assessment and plan:  COVID-19 infection.  -Recommend plenty of fluids and rest.  We will send in Tessalon Perles 100 mg 1 every 8 hours as needed for cough -Concur with pulse oximetry to monitor her oxygen levels and be in touch if these are dropping down the low 90 range -We did suggest she try Tylenol instead of regular Motrin for her headaches and body aches and could supplement if necessary with low-dose nonsteroidal -She is aware of minimum of 10-day quarantine from onset of symptoms and at least 24 hours after fever resolution     I discussed the assessment and treatment plan with the patient. The patient was provided an opportunity to ask questions and all were answered. The patient agreed with the plan and demonstrated an understanding of the instructions.   The patient was advised to call back or seek an in-person evaluation if the symptoms worsen or if the condition fails to improve as anticipated.     Carolann Littler, MD

## 2019-06-03 ENCOUNTER — Other Ambulatory Visit: Payer: Managed Care, Other (non HMO)

## 2019-06-15 ENCOUNTER — Telehealth: Payer: Managed Care, Other (non HMO) | Admitting: Family Medicine

## 2019-06-15 ENCOUNTER — Telehealth (INDEPENDENT_AMBULATORY_CARE_PROVIDER_SITE_OTHER): Payer: Managed Care, Other (non HMO) | Admitting: Family Medicine

## 2019-06-15 ENCOUNTER — Other Ambulatory Visit: Payer: Self-pay

## 2019-06-15 DIAGNOSIS — E559 Vitamin D deficiency, unspecified: Secondary | ICD-10-CM

## 2019-06-15 DIAGNOSIS — U071 COVID-19: Secondary | ICD-10-CM

## 2019-06-15 DIAGNOSIS — D509 Iron deficiency anemia, unspecified: Secondary | ICD-10-CM | POA: Diagnosis not present

## 2019-06-15 NOTE — Progress Notes (Signed)
This visit type was conducted due to national recommendations for restrictions regarding the COVID-19 pandemic in an effort to limit this patient's exposure and mitigate transmission in our community.   Virtual Visit via Video Note  I connected with [name] on 06/15/19 at  3:45 PM EST by a video enabled telemedicine application and verified that I am speaking with the correct person using two identifiers.  Location patient: home Location provider:work or home office Persons participating in the virtual visit: patient, provider  I discussed the limitations of evaluation and management by telemedicine and the availability of in person appointments. The patient expressed understanding and agreed to proceed.   HPI: [name] had Covid infection around the middle of February.  She started with symptoms on the 14th was diagnosed on the 15th.  She had significant weakness and muscle aches and cough and still has some cough now.  Overall improving but still feels fairly rundown.  She has been taking some Tessalon for cough.  She has been out of work for the past couple weeks.  She is requesting work note to go back.  She would first like to check her vitamin D and iron levels.  She has long history of vitamin D deficiency and iron deficiency anemia.  She is struggled at times to tolerate oral iron replacement.  She denies any recent fever.  No dyspnea.  She is well past the 10-day quarantine.  For acute Covid.  She is not immunosuppressed.   ROS: See pertinent positives and negatives per HPI.  Past Medical History:  Diagnosis Date  . 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 FATIGUE 05/04/2009  . Headache(784.0)   . HYPOKALEMIA 10/17/2009  . LOW BLOOD PRESSURE 10/17/2009  . NIGHT SWEATS 05/04/2009  . Polydipsia 05/04/2009  . SHINGLES, HX OF 11/24/2008  . SINUSITIS, ACUTE 03/07/2009  . [place] SHOULDER REGION 11/09/2008    Past Surgical History:  Procedure Laterality Date  . EXTERNAL AUDITORY  CANAL RECONSTRUCTION     tubes as a [place]  . TUBAL LIGATION  2007    Family History  Problem Relation Age of Onset  . Arthritis Other   . Hyperlipidemia Other   . Hypertension Other   . Cancer Other        lung  . Diabetes Other     SOCIAL HX: Non-smoker   Current Outpatient Medications:  .  benzonatate (TESSALON) 100 MG capsule, Take 1 capsule (100 mg total) by mouth 3 (three) times daily as needed for cough., Disp: 30 capsule, Rfl: 0 .  cyclobenzaprine (FLEXERIL) 5 MG tablet, Take 1 tablet (5 mg total) by mouth 3 (three) times daily as needed for muscle spasms., Disp: 30 tablet, Rfl: 5 .  Diclofenac Sodium 2 % SOLN, Place 2 g onto the skin 2 (two) times daily., Disp: 112 g, Rfl: 3 .  diphenhydrAMINE (BENADRYL) 25 MG tablet, Take 25 mg by mouth every 6 (six) hours as needed., Disp: , Rfl:  .  ferrous sulfate (SLOW IRON) 160 (50 Fe) MG TBCR SR tablet, Take 1 tablet (160 mg total) by mouth daily., Disp: 30 each, Rfl: 5 .  ibuprofen (ADVIL,MOTRIN) 200 MG tablet, Take 200 mg by mouth every 6 (six) hours as needed., Disp: , Rfl:  .  SUMAtriptan (IMITREX) 100 MG tablet, Take 1 tablet (100 mg total) by mouth every 2 (two) hours as needed for migraine. May repeat in 2 hours if headache persists or recurs., Disp: 10 tablet, Rfl: 3 .  Vitamin D, Ergocalciferol, (DRISDOL)  1.25 MG (50000 UT) CAPS capsule, Take 1 capsule (50,000 Units total) by mouth every 7 (seven) days., Disp: 12 capsule, Rfl: 1  EXAM:  VITALS per patient if applicable:  GENERAL: alert, oriented, appears well and in no acute distress  HEENT: atraumatic, conjunttiva clear, no obvious abnormalities on inspection of external nose and ears  NECK: normal movements of the head and neck  LUNGS: on inspection no signs of respiratory distress, breathing rate appears normal, no obvious gross SOB, gasping or wheezing  CV: no obvious cyanosis  MS: moves all visible extremities without noticeable abnormality  PSYCH/NEURO:  pleasant and cooperative, no obvious depression or anxiety, speech and thought processing grossly intact  ASSESSMENT AND PLAN:  Discussed the following assessment and plan:  Vitamin D deficiency - Plan: VITAMIN D 25 Hydroxy (Vit-D Deficiency, Fractures)  Iron deficiency anemia, unspecified iron deficiency anemia type - Plan: Ferritin, CBC with Differential/Platelet, Iron and TIBC  COVID-19 virus infection  -We will check future labs for tomorrow including 25-hydroxy vitamin D, ferritin, CBC, serum iron and TIBC -We will plan to go back to work hopefully by early next week.  She is aware that her cough could linger for several weeks.    I discussed the assessment and treatment plan with the patient. The patient was provided an opportunity to ask questions and all were answered. The patient agreed with the plan and demonstrated an understanding of the instructions.   The patient was advised to call back or seek an in-person evaluation if the symptoms worsen or if the condition fails to improve as anticipated.     Carolann Littler, MD

## 2019-06-16 ENCOUNTER — Other Ambulatory Visit (INDEPENDENT_AMBULATORY_CARE_PROVIDER_SITE_OTHER): Payer: Managed Care, Other (non HMO)

## 2019-06-16 DIAGNOSIS — E559 Vitamin D deficiency, unspecified: Secondary | ICD-10-CM | POA: Diagnosis not present

## 2019-06-16 DIAGNOSIS — D509 Iron deficiency anemia, unspecified: Secondary | ICD-10-CM | POA: Diagnosis not present

## 2019-06-16 LAB — CBC WITH DIFFERENTIAL/PLATELET
Basophils Absolute: 0.1 10*3/uL (ref 0.0–0.1)
Basophils Relative: 0.9 % (ref 0.0–3.0)
Eosinophils Absolute: 0.2 10*3/uL (ref 0.0–0.7)
Eosinophils Relative: 2.8 % (ref 0.0–5.0)
HCT: 35.3 % — ABNORMAL LOW (ref 36.0–46.0)
Hemoglobin: 11.9 g/dL — ABNORMAL LOW (ref 12.0–15.0)
Lymphocytes Relative: 33.9 % (ref 12.0–46.0)
Lymphs Abs: 2.1 10*3/uL (ref 0.7–4.0)
MCHC: 33.7 g/dL (ref 30.0–36.0)
MCV: 98.1 fl (ref 78.0–100.0)
Monocytes Absolute: 0.4 10*3/uL (ref 0.1–1.0)
Monocytes Relative: 7 % (ref 3.0–12.0)
Neutro Abs: 3.5 10*3/uL (ref 1.4–7.7)
Neutrophils Relative %: 55.4 % (ref 43.0–77.0)
Platelets: 284 10*3/uL (ref 150.0–400.0)
RBC: 3.6 Mil/uL — ABNORMAL LOW (ref 3.87–5.11)
RDW: 13.1 % (ref 11.5–15.5)
WBC: 6.3 10*3/uL (ref 4.0–10.5)

## 2019-06-16 LAB — FERRITIN: Ferritin: 38.1 ng/mL (ref 10.0–291.0)

## 2019-06-16 LAB — VITAMIN D 25 HYDROXY (VIT D DEFICIENCY, FRACTURES): VITD: 33.01 ng/mL (ref 30.00–100.00)

## 2019-06-17 ENCOUNTER — Encounter: Payer: Self-pay | Admitting: Family Medicine

## 2019-06-17 ENCOUNTER — Telehealth: Payer: Self-pay | Admitting: Family Medicine

## 2019-06-17 LAB — IRON, TOTAL/TOTAL IRON BINDING CAP
%SAT: 38 % (calc) (ref 16–45)
Iron: 90 ug/dL (ref 40–190)
TIBC: 239 mcg/dL (calc) — ABNORMAL LOW (ref 250–450)

## 2019-06-17 NOTE — Telephone Encounter (Signed)
Hemoglobin, serum iron, ferritin all improved.  Vitamin D is also improved but still low normal range at 33.  We will continue over-the-counter vitamin D at least 1000 international units daily

## 2019-06-17 NOTE — Telephone Encounter (Signed)
Pt is requesting again a note for being out of work. She can not receive note via mychart because she does not have a printer. She has to have a physical copy today before the office closes so she can pick it up.

## 2019-06-17 NOTE — Telephone Encounter (Signed)
Patient is requesting a work not that states that Dr. Elease Hashimoto wrote her out of work until he could get her lab results, wrote for Monday thru Sunday of this week.    Patient will need another note for When she can go back to work stating due to having Covid and that she is released to go back to work on whatever day Dr. Elease Hashimoto decides.   Patient stated Dr. Elease Hashimoto stated she could get these notes today.   She is reminding Dr. Elease Hashimoto that he would call her today with her lab results.

## 2019-06-17 NOTE — Telephone Encounter (Signed)
Please see messages.

## 2019-06-17 NOTE — Telephone Encounter (Signed)
Called patient and gave her the message from Dr. Elease Hashimoto and let her know that her work note is ready and I have placed it up front for patient to pick up. Patient verbalized an understanding.

## 2019-06-29 ENCOUNTER — Other Ambulatory Visit: Payer: Self-pay

## 2019-06-29 ENCOUNTER — Telehealth (INDEPENDENT_AMBULATORY_CARE_PROVIDER_SITE_OTHER): Payer: Managed Care, Other (non HMO) | Admitting: Family Medicine

## 2019-06-29 DIAGNOSIS — R112 Nausea with vomiting, unspecified: Secondary | ICD-10-CM

## 2019-06-29 DIAGNOSIS — U071 COVID-19: Secondary | ICD-10-CM

## 2019-06-29 DIAGNOSIS — G8929 Other chronic pain: Secondary | ICD-10-CM

## 2019-06-29 DIAGNOSIS — M25511 Pain in right shoulder: Secondary | ICD-10-CM

## 2019-06-29 DIAGNOSIS — R0600 Dyspnea, unspecified: Secondary | ICD-10-CM

## 2019-06-29 MED ORDER — ONDANSETRON 4 MG PO TBDP: 4.0000 mg | ORAL_TABLET | Freq: Three times a day (TID) | ORAL | 0 refills | Status: DC | PRN

## 2019-06-29 NOTE — Progress Notes (Signed)
This visit type was conducted due to national recommendations for restrictions regarding the COVID-19 pandemic in an effort to limit this patient's exposure and mitigate transmission in our community.   Virtual Visit via Video Note  I connected with [name] on 06/29/19 at 11:30 AM EDT by a video enabled telemedicine application and verified that I am speaking with the correct person using two identifiers.  Location patient: home Location provider:work or home office Persons participating in the virtual visit: patient, provider  I discussed the limitations of evaluation and management by telemedicine and the availability of in person appointments. The patient expressed understanding and agreed to proceed.   HPI: [name] had Covid infection several weeks ago.  She has continued to battle with some fatigue malaise and dyspnea.  She tried return to work Monday at [employer].  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  Monday night she developed some vomiting along with headache and diarrhea.  No abdominal pain.  No fever.  Past history of bilateral tubal ligation.  She still has some nausea at this point but no vomiting.  She has kept down some chicken noodle soup today.  She has noticed some increased dyspnea with exertion.  She realizes some of this may be deconditioning from her recent Covid infection.  However, she has home pulse oximeter and has been registering consistently around 91 to 92%.  No pleuritic pain.  No hemoptysis.  No fever.  No leg edema or leg pain.  She has some chronic right shoulder difficulties.  She has seen sports medicine and had steroid injection.  She continues to have shoulder difficulties which are impairing her work.  She also had recent breast biopsy.  She had discussed with her work getting FMLA papers completed.  She feels unable to do her job at this point because of predominantly the dyspnea and fatigue following her  Covid.  She has longstanding history of iron deficiency anemia and had recent hemoglobin which is stable and improved and iron studies are improved following iron infusion last year..       ROS: See pertinent positives and negatives per HPI.  Past Medical History:  Diagnosis Date  . 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 FATIGUE 05/04/2009  . Headache(784.0)   . HYPOKALEMIA 10/17/2009  . LOW BLOOD PRESSURE 10/17/2009  . NIGHT SWEATS 05/04/2009  . Polydipsia 05/04/2009  . SHINGLES, HX OF 11/24/2008  . SINUSITIS, ACUTE 03/07/2009  . [place] SHOULDER REGION 11/09/2008    Past Surgical History:  Procedure Laterality Date  . EXTERNAL AUDITORY CANAL RECONSTRUCTION     tubes as a [place]  . TUBAL LIGATION  2007    Family History  Problem Relation Age of Onset  . Arthritis Other   . Hyperlipidemia Other   . Hypertension Other   . Cancer Other        lung  . Diabetes Other     SOCIAL HX: Non-smoker   Current Outpatient Medications:  .  cyclobenzaprine (FLEXERIL) 5 MG tablet, Take 1 tablet (5 mg total) by mouth 3 (three) times daily as needed for muscle spasms., Disp: 30 tablet, Rfl: 5 .  Diclofenac Sodium 2 % SOLN, Place 2 g onto the skin 2 (two) times daily., Disp: 112 g, Rfl: 3 .  diphenhydrAMINE (BENADRYL) 25 MG tablet, Take 25 mg by mouth every 6 (six) hours as needed., Disp: , Rfl:  .  ferrous sulfate (SLOW IRON) 160 (50 Fe) MG TBCR SR  tablet, Take 1 tablet (160 mg total) by mouth daily., Disp: 30 each, Rfl: 5 .  ibuprofen (ADVIL,MOTRIN) 200 MG tablet, Take 200 mg by mouth every 6 (six) hours as needed., Disp: , Rfl:  .  ondansetron (ZOFRAN ODT) 4 MG disintegrating tablet, Take 1 tablet (4 mg total) by mouth every 8 (eight) hours as needed for nausea or vomiting., Disp: 12 tablet, Rfl: 0 .  SUMAtriptan (IMITREX) 100 MG tablet, Take 1 tablet (100 mg total) by mouth every 2 (two) hours as needed for migraine. May repeat in 2 hours if headache persists or recurs., Disp: 10  tablet, Rfl: 3 .  Vitamin D, Ergocalciferol, (DRISDOL) 1.25 MG (50000 UT) CAPS capsule, Take 1 capsule (50,000 Units total) by mouth every 7 (seven) days., Disp: 12 capsule, Rfl: 1  EXAM:  VITALS per patient if applicable:  GENERAL: alert, oriented, appears well and in no acute distress  HEENT: atraumatic, conjunttiva clear, no obvious abnormalities on inspection of external nose and ears  NECK: normal movements of the head and neck  LUNGS: on inspection no signs of respiratory distress, breathing rate appears normal, no obvious gross SOB, gasping or wheezing  CV: no obvious cyanosis  MS: moves all visible extremities without noticeable abnormality  PSYCH/NEURO: pleasant and cooperative, no obvious depression or anxiety, speech and thought processing grossly intact  ASSESSMENT AND PLAN:  Discussed the following assessment and plan:  #1 recent COVID-19 infection.  She has some residual symptoms of increased fatigue and dyspnea which may be related  -She is well past quarantine period.    #2 increased dyspnea with exertion.  Possibly related to #1.  Concerning is the fact that she is describing oxygen levels in the low 90% range which is very atypical at this point.  Needs further evaluation.  -We will plan to see in office Friday for further evaluation with probable chest x-ray and possibly other evaluation as well at that time if indicated  #3 chronic right shoulder pains-unchanged following recent steroid injection  #4 nausea vomiting and diarrhea with acute onset 2 days ago.  She is keeping down some fluids  -Send in Zofran 4 mg every 8 hours as needed for nausea and vomiting -Clear liquids as tolerated     I discussed the assessment and treatment plan with the patient. The patient was provided an opportunity to ask questions and all were answered. The patient agreed with the plan and demonstrated an understanding of the instructions.   The patient was advised to call  back or seek an in-person evaluation if the symptoms worsen or if the condition fails to improve as anticipated.     Carolann Littler, MD

## 2019-07-01 ENCOUNTER — Ambulatory Visit (INDEPENDENT_AMBULATORY_CARE_PROVIDER_SITE_OTHER): Payer: Managed Care, Other (non HMO) | Admitting: Family Medicine

## 2019-07-01 ENCOUNTER — Ambulatory Visit (INDEPENDENT_AMBULATORY_CARE_PROVIDER_SITE_OTHER): Payer: Managed Care, Other (non HMO)

## 2019-07-01 ENCOUNTER — Encounter: Payer: Self-pay | Admitting: Family Medicine

## 2019-07-01 ENCOUNTER — Other Ambulatory Visit: Payer: Self-pay

## 2019-07-01 VITALS — BP 110/70 | HR 71 | Temp 98.6°F | Ht 67.0 in | Wt 107.3 lb

## 2019-07-01 DIAGNOSIS — R0600 Dyspnea, unspecified: Secondary | ICD-10-CM

## 2019-07-01 DIAGNOSIS — U071 COVID-19: Secondary | ICD-10-CM | POA: Diagnosis not present

## 2019-07-01 NOTE — Patient Instructions (Addendum)
Gradually increase your physical activity to build up your stamina  Go ahead and get follow up with Dr Tamala Julian for your shoulder and for your breast biopsy.

## 2019-07-01 NOTE — Progress Notes (Signed)
Subjective:     Patient ID: [name], female   DOB: 1970-07-01, 49 y.o.   MRN: [id number]  HPI [name] is seen predominantly to further evaluate dyspnea complaints following recent Covid infection. Refer to virtual note from 2 days ago for details   "[name] had Covid infection several weeks ago.  She has continued to battle with some fatigue malaise and dyspnea.  She tried return to work Monday at [employer].  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  Monday night she developed some vomiting along with headache and diarrhea.  No abdominal pain.  No fever.  Past history of bilateral tubal ligation.  She still has some nausea at this point but no vomiting.  She has kept down some chicken noodle soup today.  She has noticed some increased dyspnea with exertion.  She realizes some of this may be deconditioning from her recent Covid infection.  However, she has home pulse oximeter and has been registering consistently around 91 to 92%.  No pleuritic pain.  No hemoptysis.  No fever.  No leg edema or leg pain."  As above, she complained of some low O2 sats but by the monitor here today we are getting 97%. She continues to have dyspnea with minimal activity. No chest pain. No pleuritic pain. No cough. No fever. She has no chronic lung problems. She has history of chronic anemia which had improved following recent iron infusion. She has lost a few pounds since her Covid infection.  She also complains of "brain fog "since her infection. She tried to return to work second week of March but had difficulties. She has been out since 16 March. She had called requesting FMLA paperwork be completed  Her diarrhea and nausea have resolved  Past Medical History:  Diagnosis Date  . 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 FATIGUE 05/04/2009  . Headache(784.0)   . HYPOKALEMIA 10/17/2009  . LOW BLOOD PRESSURE 10/17/2009  . NIGHT SWEATS 05/04/2009  . Polydipsia 05/04/2009  . SHINGLES, HX OF  11/24/2008  . SINUSITIS, ACUTE 03/07/2009  . [place] SHOULDER REGION 11/09/2008   Past Surgical History:  Procedure Laterality Date  . EXTERNAL AUDITORY CANAL RECONSTRUCTION     tubes as a [place]  . TUBAL LIGATION  2007    reports that she has never smoked. She has never used smokeless tobacco. No history on file for alcohol and drug. family history includes Arthritis in an other family member; Cancer in an other family member; Diabetes in an other family member; Hyperlipidemia in an other family member; Hypertension in an other family member. No Known Allergies   Review of Systems  Constitutional: Negative for chills and fever.  Respiratory: Positive for shortness of breath. Negative for cough and wheezing.   Cardiovascular: Negative for chest pain, palpitations and leg swelling.  Gastrointestinal: Negative for abdominal pain.  Genitourinary: Negative for dysuria.  Neurological: Positive for weakness and headaches. Negative for syncope and numbness.  Hematological: Negative for adenopathy.       Objective:   Physical Exam Vitals reviewed.  Constitutional:      Appearance: Normal appearance.  Cardiovascular:     Rate and Rhythm: Normal rate and regular rhythm.     Heart sounds: No murmur.  Pulmonary:     Effort: Pulmonary effort is normal.     Breath sounds: Normal breath sounds. No wheezing or rales.  Musculoskeletal:     Cervical back: Neck supple.  Right lower leg: No edema.     Left lower leg: No edema.  Lymphadenopathy:     Cervical: No cervical adenopathy.  Skin:    Findings: No rash.  Neurological:     Mental Status: She is alert.        Assessment:     Recent Covid infection. Patient complaining of dyspnea with minimal exertion. She is in no respiratory distress at rest and pulse oximetry here today is 97%. She is also complaining of some persistent headache and "brain fog "following Covid infection.  Low clinical suspicion for  PE.    Plan:     -Obtain PA and lateral chest x-ray.  No evidence for cardiomegaly.  No acute findings.  To be over read by radiology  -We have encouraged her to step up her activities to try to build back some stamina.  Gradually increase activities  -We will complete her FMLA paper with 8 out of work March 16 probably through April 1 and then reassess  Eulas Post MD [facility]

## 2019-07-08 ENCOUNTER — Telehealth: Payer: Self-pay | Admitting: Family Medicine

## 2019-07-08 NOTE — Telephone Encounter (Signed)
Pt came in and dropped off FMLA forms ([employer]) to be completed by the provider.  Upon completion pt would like to have it faxed [phone] Attn: Dennison Nancy at [facility]  Forms placed in providers folder for completion.

## 2019-07-08 NOTE — Telephone Encounter (Signed)
Paperwork has not been dropped off yet but pt would like to speak with you

## 2019-07-08 NOTE — Telephone Encounter (Signed)
Pt will drop off paperwork for Dr. Elease Hashimoto to fill out for [facility] and short term disability.   Pt also would like Dr.Buchette to call her today or Monday to go over her return to work date.

## 2019-07-08 NOTE — Telephone Encounter (Signed)
Placed in red folder  

## 2019-07-13 ENCOUNTER — Encounter: Payer: Self-pay | Admitting: Family Medicine

## 2019-07-13 ENCOUNTER — Other Ambulatory Visit: Payer: Self-pay

## 2019-07-13 ENCOUNTER — Ambulatory Visit (INDEPENDENT_AMBULATORY_CARE_PROVIDER_SITE_OTHER): Payer: Managed Care, Other (non HMO) | Admitting: Family Medicine

## 2019-07-13 DIAGNOSIS — M25511 Pain in right shoulder: Secondary | ICD-10-CM | POA: Diagnosis not present

## 2019-07-13 DIAGNOSIS — G8929 Other chronic pain: Secondary | ICD-10-CM

## 2019-07-13 MED ORDER — PREDNISONE 50 MG PO TABS: ORAL_TABLET | ORAL | 0 refills | Status: DC

## 2019-07-13 NOTE — Patient Instructions (Addendum)
10 lbs lifting limit for upper extremity for 2 weeks then 2 week trial Prednisone  Baby asprin 81 mg CoQ10 200 mg If not better in 2 weeks call or write and will order MRI Otherwise see me in 6 weeks

## 2019-07-13 NOTE — Progress Notes (Signed)
[place] [address] [place] Phone: [phone] Subjective:   I [name] am serving as a Education administrator for Dr. Hulan Saas.  This visit occurred during the SARS-CoV-2 public health emergency.  Safety protocols were in place, including screening questions prior to the visit, additional usage of staff PPE, and extensive cleaning of exam room while observing appropriate contact time as indicated for disinfecting solutions.   I'm seeing this patient by the request  of:  Eulas Post, MD  CC: Right shoulder pain follow-up  [id number]   03/03/2020 No improvement with conservative therapy at this time.  Have avoided formal physical therapy eval secondary to coronavirus outbreak.  Injection given today patient tolerated procedure well.  Discussed icing regimen.  Patient will be put on some light duty at work for the next 4 weeks and follow-up with me again in 4 to 8 weeks  07/13/2019 Almyra Free Case Galindez is a 49 y.o. female coming in with complaint of right shoulder pain. Patient has been of work due to [employer]. Shoulder still bothering her. Tapes boxes and lifting heavy packages. Patient states her work duties are too much for her shoulder. Repetitive motion is causing the pain.  Patient states that she does not know if she has made any significant improvement since the injection in November.  Had been working but continued to have chronic pain.  Now feels like there is increasing weakness.    Past Medical History:  Diagnosis Date  . 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 FATIGUE 05/04/2009  . Headache(784.0)   . HYPOKALEMIA 10/17/2009  . LOW BLOOD PRESSURE 10/17/2009  . NIGHT SWEATS 05/04/2009  . Polydipsia 05/04/2009  . SHINGLES, HX OF 11/24/2008  . SINUSITIS, ACUTE 03/07/2009  . [place] SHOULDER REGION 11/09/2008   Past Surgical History:  Procedure Laterality Date  . EXTERNAL AUDITORY CANAL RECONSTRUCTION     tubes as a [place]  .  TUBAL LIGATION  2007   Social History   Socioeconomic History  . Marital status: Divorced    Spouse name: Not on file  . Number of children: Not on file  . Years of education: Not on file  . Highest education level: Not on file  Occupational History  . Not on file  Tobacco Use  . Smoking status: Never Smoker  . Smokeless tobacco: Never Used  Substance and Sexual Activity  . Alcohol use: Not on file  . Drug use: Not on file  . Sexual activity: Not on file  Other Topics Concern  . Not on file  Social History Narrative   Works for [employer] of 2 but mostly lives by herself   Social Determinants of Radio broadcast assistant Strain:   . Difficulty of Paying Living Expenses:   Food Insecurity:   . Worried About Charity fundraiser in the Last Year:   . Arboriculturist in the Last Year:   Transportation Needs:   . Film/video editor (Medical):   Marland Kitchen Lack of Transportation (Non-Medical):   Physical Activity:   . Days of Exercise per Week:   . Minutes of Exercise per Session:   Stress:   . Feeling of Stress :   Social Connections:   . Frequency of Communication with Friends and Family:   . Frequency of Social Gatherings with Friends and Family:   . Attends Religious Services:   . Active Member of Clubs or Organizations:   .  Attends Archivist Meetings:   Marland Kitchen Marital Status:    No Known Allergies Family History  Problem Relation Age of Onset  . Arthritis Other   . Hyperlipidemia Other   . Hypertension Other   . Cancer Other        lung  . Diabetes Other     Current Outpatient Medications (Endocrine & Metabolic):  .  predniSONE (DELTASONE) 50 MG tablet, 1 tablet by mouth daily   Current Outpatient Medications (Respiratory):  .  diphenhydrAMINE (BENADRYL) 25 MG tablet, Take 25 mg by mouth every 6 (six) hours as needed.  Current Outpatient Medications (Analgesics):  .  ibuprofen (ADVIL,MOTRIN) 200 MG tablet, Take 200 mg by mouth every 6  (six) hours as needed. .  SUMAtriptan (IMITREX) 100 MG tablet, Take 1 tablet (100 mg total) by mouth every 2 (two) hours as needed for migraine. May repeat in 2 hours if headache persists or recurs.  Current Outpatient Medications (Hematological):  .  ferrous sulfate (SLOW IRON) 160 (50 Fe) MG TBCR SR tablet, Take 1 tablet (160 mg total) by mouth daily.  Current Outpatient Medications (Other):  .  cyclobenzaprine (FLEXERIL) 5 MG tablet, Take 1 tablet (5 mg total) by mouth 3 (three) times daily as needed for muscle spasms. .  Diclofenac Sodium 2 % SOLN, Place 2 g onto the skin 2 (two) times daily. .  ondansetron (ZOFRAN ODT) 4 MG disintegrating tablet, Take 1 tablet (4 mg total) by mouth every 8 (eight) hours as needed for nausea or vomiting. .  Vitamin D, Ergocalciferol, (DRISDOL) 1.25 MG (50000 UT) CAPS capsule, Take 1 capsule (50,000 Units total) by mouth every 7 (seven) days.   Reviewed prior external information including notes and imaging from  primary care provider As well as notes that were available from care everywhere and other healthcare systems.  Past medical history, social, surgical and family history all reviewed in electronic medical record.  No pertanent information unless stated regarding to the chief complaint.   Review of Systems:  No headache, visual changes, nausea, vomiting, diarrhea, constipation, dizziness, abdominal pain, skin rash, fevers, chills, night sweats, weight loss, swollen lymph nodes, , joint swelling, chest pain,  mood changes. POSITIVE muscle aches, body aches, shortness of breath still from Covid  Objective  Blood pressure 100/80, pulse 80, height 5\' 7"  (1.702 m), weight 105 lb (47.6 kg), SpO2 96 %.   General: No apparent distress alert and oriented x3 mood and affect normal, dressed appropriately.  HEENT: Pupils equal, extraocular movements intact  Respiratory: Patient's speak in full sentences and does not appear short of breath  Cardiovascular:  No lower extremity edema, non tender, no erythema  Neuro: Cranial nerves II through XII are intact, neurovascularly intact in all extremities with 2+ DTRs and 2+ pulses.  Gait normal with good balance and coordination.  MSK:  Non tender with full range of motion and good stability and symmetric strength and tone of shoulders, elbows, wrist, hip, knee and ankles bilaterally.     Impression and Recommendations:     This case required medical decision making of moderate complexity. The above documentation has been reviewed and is accurate and complete Lyndal Pulley, DO       Note: This dictation was prepared with Dragon dictation along with smaller phrase technology. Any transcriptional errors that result from this process are unintentional.

## 2019-07-14 ENCOUNTER — Encounter: Payer: Self-pay | Admitting: Family Medicine

## 2019-07-14 NOTE — Assessment & Plan Note (Signed)
Chronic problem with exacerbation.  Patient has not made any significant improvement.  On ultrasound no significant large tear of the potential small degenerative tears have been noted and subacromial bursitis.  Patient though has not made any significant improvement and need to rule out any type of labral pathology.  MR arthrogram ordered today.  Patient has failed over 6 months of conservative therapy at this time as well as injections.  Prednisone given to help with pain while she gets this done and still giving him some limitations at work.  Discussed medication management including cyclobenzaprine and topical anti-inflammatories

## 2019-07-14 NOTE — Telephone Encounter (Signed)
Patient called wanting to know if her FMLA forms have been completed.   The patient wanted to know if he will be able to do a note for her to go back to work on Wednesday? ? She's been out because of COVID and she is having a breast aspiration on Monday and is usually sore after that and wants to go back to work on Wednesday.  The patient would like for Jinny Blossom to call her today since we are closed tomorrow for Good Friday. She needs to know something to let her employer know what is going on with the FMLA forms and a letter to go back to work.  Please advise

## 2019-07-14 NOTE — Telephone Encounter (Signed)
FMLA paperwork is in red folder on your desk.  Please see message.  OK to complete note for patient to return to work on Wednesday or does patient need to have the place that is doing her breast aspiration complete a note for her?  Please advise.

## 2019-07-17 NOTE — Telephone Encounter (Signed)
I would have those doing her breast biopsy advise regarding how long she should be out of work.

## 2019-07-18 ENCOUNTER — Ambulatory Visit
Admission: RE | Admit: 2019-07-18 | Discharge: 2019-07-18 | Disposition: A | Discharge status: Home / Self Care | Payer: Managed Care, Other (non HMO) | Source: Ambulatory Visit | Attending: Obstetrics and Gynecology | Admitting: Obstetrics and Gynecology

## 2019-07-18 ENCOUNTER — Other Ambulatory Visit: Payer: Self-pay | Admitting: Obstetrics and Gynecology

## 2019-07-18 ENCOUNTER — Other Ambulatory Visit: Payer: Self-pay

## 2019-07-18 ENCOUNTER — Telehealth: Payer: Self-pay | Admitting: Family Medicine

## 2019-07-18 DIAGNOSIS — N6320 Unspecified lump in the left breast, unspecified quadrant: Secondary | ICD-10-CM

## 2019-07-18 DIAGNOSIS — R921 Mammographic calcification found on diagnostic imaging of breast: Secondary | ICD-10-CM

## 2019-07-18 DIAGNOSIS — N6001 Solitary cyst of right breast: Secondary | ICD-10-CM

## 2019-07-18 NOTE — Telephone Encounter (Signed)
FMLA completed.

## 2019-07-18 NOTE — Telephone Encounter (Signed)
This has already been faxed 

## 2019-07-18 NOTE — Telephone Encounter (Signed)
Lvm and already told pt these were faxed over

## 2019-07-18 NOTE — Telephone Encounter (Signed)
Patient had [employer] fax FMLA forms  Fax to: [phone]  Disposition: Dr's Folder

## 2019-07-18 NOTE — Telephone Encounter (Signed)
Already called and talked with pt she needs to ask breast center pt was up stating that [name] is her phsyican

## 2019-07-18 NOTE — Telephone Encounter (Signed)
Pt is requesting Dr. Elease Hashimoto extend her leave until next Monday she would like a call to go over this request. Pt is requesting a call before 2:30 today because she is having a procedure done.

## 2019-07-18 NOTE — Telephone Encounter (Signed)
Pt states she needs FMLA paper work filled she is not very happy she states she will call the breast center to see what they advise but will still need a note from you. Pt is upset because she states that they closed her case for FMLA since paperwork has not been filled out

## 2019-07-19 NOTE — Telephone Encounter (Signed)
Note has been printed and placed up front lvm for pt

## 2019-07-19 NOTE — Telephone Encounter (Signed)
OK to release back to work for 07-20-19

## 2019-07-19 NOTE — Telephone Encounter (Signed)
This has been faxed back to the [employer]

## 2019-07-19 NOTE — Telephone Encounter (Signed)
Pt spoke to her HR director and they are stating that she needs a note from her PCP stating that she is ok to return back to work. She would like to pick it up from the office either today or tomorrow the latest.   Pt can be reached at [phone] -ok to leave a detailed message per pt

## 2019-07-19 NOTE — Telephone Encounter (Signed)
Please advise 

## 2019-07-25 ENCOUNTER — Telehealth: Payer: Self-pay | Admitting: Family Medicine

## 2019-07-25 NOTE — Telephone Encounter (Signed)
Patient called stating that she is needing a more in depth letter for her job regarding the restrictions she has been given. They need to know what the 2 week trial returning to work would look like. She also mentioned including the diagnoses and how she is being treated.   Patient would like to pick the letter up when it is ready.

## 2019-07-25 NOTE — Telephone Encounter (Signed)
Pt spoke to her HR dept and the lady stated that they faxed over a request for information for medical records between 3/3-3/19 and copies of the findings from the x-rays on lung/heart in order to compete her FMLA.   Pt states she signed a release of information through her Human Resources/[employer] to give the information to them.   Phone: [phone] Fax: [phone] ATTN: Dorthula Rue [phone]    Pt can be reached at [phone]

## 2019-07-25 NOTE — Telephone Encounter (Signed)
Spoke to [place] and she stated that we have not received the fax. I called the 1-800 [place] provided and spoke to [facility] and she stated she will re-fax the information over.   I will be on the look out and will give this info to [facility] to process

## 2019-07-25 NOTE — Telephone Encounter (Signed)
Please advise on record release

## 2019-07-25 NOTE — Telephone Encounter (Signed)
Received the faxed and gave it to [facility]

## 2019-07-26 ENCOUNTER — Other Ambulatory Visit: Payer: Self-pay

## 2019-07-26 ENCOUNTER — Ambulatory Visit (INDEPENDENT_AMBULATORY_CARE_PROVIDER_SITE_OTHER): Payer: Managed Care, Other (non HMO)

## 2019-07-26 DIAGNOSIS — G8929 Other chronic pain: Secondary | ICD-10-CM

## 2019-07-26 DIAGNOSIS — M25511 Pain in right shoulder: Secondary | ICD-10-CM

## 2019-07-26 NOTE — Telephone Encounter (Signed)
Left message for patient to call back in regards to letter details.

## 2019-07-26 NOTE — Telephone Encounter (Signed)
Patient would like to go ahead with MRI as she is not getting any better. Will update letter and place up front for patient. Also called patient back as she will need to get an xray of shoulder as well when she picks up letter in order for MRI to be approved.

## 2019-07-29 ENCOUNTER — Telehealth: Payer: Self-pay | Admitting: Family Medicine

## 2019-07-29 NOTE — Telephone Encounter (Signed)
Patient called stating that for her FMLA and disability, [employer] is needing xray results and notes/MRI order faxed to them.  Attention: Izora Ribas Claim# [id number] Fax # [phone] and Phone # [phone]  Please advise.

## 2019-07-29 NOTE — Telephone Encounter (Signed)
Pt spoke to [facility] and they told her they have not received her medical records. Informed pt that it was sent to [facility] on April 12th and there is a 14 business day turn around and her request currently has 9 business days left. Pt understood and had nothing further

## 2019-08-01 NOTE — Telephone Encounter (Signed)
Left message for patient to authorize [place] to send documents to [facility].

## 2019-08-01 NOTE — Telephone Encounter (Signed)
Patient called back giving authorization to send documents as requested.

## 2019-08-04 NOTE — Telephone Encounter (Signed)
[name] called to update the fax number for [name] at [facility] for her FMLA.   Fax number: [phone]  Claim number: [id number]  I transferred the patient to [name] and she gave the patient [name] number and let her know that the release was sent to [facility] 04/13 and that there is normally a 14 day turn around.

## 2019-08-05 NOTE — Telephone Encounter (Signed)
Noted  

## 2019-08-13 HISTORY — PX: BREAST BIOPSY: SHX20

## 2019-08-15 ENCOUNTER — Ambulatory Visit
Admission: RE | Admit: 2019-08-15 | Discharge: 2019-08-15 | Disposition: A | Discharge status: Home / Self Care | Payer: Managed Care, Other (non HMO) | Source: Ambulatory Visit | Attending: Family Medicine | Admitting: Family Medicine

## 2019-08-15 ENCOUNTER — Other Ambulatory Visit: Payer: Self-pay

## 2019-08-15 DIAGNOSIS — G8929 Other chronic pain: Secondary | ICD-10-CM

## 2019-08-15 DIAGNOSIS — M25511 Pain in right shoulder: Secondary | ICD-10-CM

## 2019-08-15 MED ORDER — IOPAMIDOL (ISOVUE-M 200) INJECTION 41%
12.0000 mL | Freq: Once | INTRAMUSCULAR | Status: AC
  Administered 2019-08-15: 12 mL via INTRA_ARTICULAR

## 2019-08-19 NOTE — Telephone Encounter (Signed)
We have received the fax from [facility] and it has been placed in the doctors red folder in the front.  The patient has called wanted these forms to be completed ASAP because she is without pay and her car is getting ready to be repossessed.   Please advise

## 2019-08-19 NOTE — Telephone Encounter (Signed)
Placed in red folder  

## 2019-08-20 ENCOUNTER — Encounter: Payer: Self-pay | Admitting: Family Medicine

## 2019-08-20 DIAGNOSIS — Z8616 Personal history of COVID-19: Secondary | ICD-10-CM | POA: Insufficient documentation

## 2019-08-20 NOTE — Telephone Encounter (Signed)
done

## 2019-08-22 NOTE — Telephone Encounter (Signed)
Forms have been faxed 

## 2019-08-22 NOTE — Telephone Encounter (Signed)
Pt has been notified that forms have been faxed over

## 2019-08-26 ENCOUNTER — Ambulatory Visit (INDEPENDENT_AMBULATORY_CARE_PROVIDER_SITE_OTHER): Payer: Managed Care, Other (non HMO) | Admitting: Family Medicine

## 2019-08-26 ENCOUNTER — Ambulatory Visit
Admission: RE | Admit: 2019-08-26 | Discharge: 2019-08-26 | Disposition: A | Discharge status: Home / Self Care | Payer: Managed Care, Other (non HMO) | Source: Ambulatory Visit | Attending: Obstetrics and Gynecology | Admitting: Obstetrics and Gynecology

## 2019-08-26 ENCOUNTER — Other Ambulatory Visit: Payer: Self-pay

## 2019-08-26 ENCOUNTER — Encounter: Payer: Self-pay | Admitting: Family Medicine

## 2019-08-26 ENCOUNTER — Ambulatory Visit: Payer: Self-pay

## 2019-08-26 VITALS — BP 100/70 | HR 75 | Ht 67.0 in | Wt 104.0 lb

## 2019-08-26 DIAGNOSIS — M19011 Primary osteoarthritis, right shoulder: Secondary | ICD-10-CM | POA: Diagnosis not present

## 2019-08-26 DIAGNOSIS — M25511 Pain in right shoulder: Secondary | ICD-10-CM | POA: Diagnosis not present

## 2019-08-26 DIAGNOSIS — M719 Bursopathy, unspecified: Secondary | ICD-10-CM | POA: Diagnosis not present

## 2019-08-26 DIAGNOSIS — N6320 Unspecified lump in the left breast, unspecified quadrant: Secondary | ICD-10-CM

## 2019-08-26 DIAGNOSIS — G8929 Other chronic pain: Secondary | ICD-10-CM

## 2019-08-26 DIAGNOSIS — D169 Benign neoplasm of bone and articular cartilage, unspecified: Secondary | ICD-10-CM

## 2019-08-26 DIAGNOSIS — R921 Mammographic calcification found on diagnostic imaging of breast: Secondary | ICD-10-CM

## 2019-08-26 DIAGNOSIS — M67919 Unspecified disorder of synovium and tendon, unspecified shoulder: Secondary | ICD-10-CM

## 2019-08-26 DIAGNOSIS — M19019 Primary osteoarthritis, unspecified shoulder: Secondary | ICD-10-CM | POA: Insufficient documentation

## 2019-08-26 DIAGNOSIS — D1601 Benign neoplasm of scapula and long bones of right upper limb: Secondary | ICD-10-CM | POA: Diagnosis not present

## 2019-08-26 NOTE — Patient Instructions (Addendum)
Good to see you [name] for [employer]  See me again in 3 months

## 2019-08-26 NOTE — Assessment & Plan Note (Signed)
Patient does have an enchondroma noted on the MRI.  When comparing to 2009 imaging patient has had some enlargement.  Cortical area of the bone though appears to be unrelated at this time.  Patient though does want a second opinion and will refer patient to orthopedic surgery to discuss.

## 2019-08-26 NOTE — Assessment & Plan Note (Signed)
Injection given today.  Diagnosed more on the MRI.  Patient is going to see a surgeon about the endochondroma but I do not think that this would need surgical intervention at this time.  Hopefully injection helps.  Follow-up with me again in 6 to 8 weeks.  Will be a chronic problem with exacerbation.

## 2019-08-26 NOTE — Assessment & Plan Note (Signed)
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  Injection given today as well as an acromioclavicular injection to try to help with some of the pain control.  Patient's MRI did show an enchondroma and patient wants second opinion to make sure conservative therapy is the right answer.  Will refer today.  Discussed icing regimen, proper lifting mechanics.  Encouraged muscle strengthening.  Patient has done rehabilitation multiple times.  Follow-up with me otherwise in 3 months

## 2019-08-26 NOTE — Progress Notes (Signed)
[facility] [address] [place] Phone: [phone] Subjective:   I [name] am serving as a Education administrator for Dr. Hulan Saas.  This visit occurred during the SARS-CoV-2 public health emergency.  Safety protocols were in place, including screening questions prior to the visit, additional usage of staff PPE, and extensive cleaning of exam room while observing appropriate contact time as indicated for disinfecting solutions.   I'm seeing this patient by the request  of:  Eulas Post, MD  CC: Shoulder pain follow-up  [id number]   07/13/2019 Chronic problem with exacerbation.  Patient has not made any significant improvement.  On ultrasound no significant large tear of the potential small degenerative tears have been noted and subacromial bursitis.  Patient though has not made any significant improvement and need to rule out any type of labral pathology.  MR arthrogram ordered today.  Patient has failed over 6 months of conservative therapy at this time as well as injections.  Prednisone given to help with pain while she gets this done and still giving him some limitations at work.  Discussed medication management including cyclobenzaprine and topical anti-inflammatories  Update 08/26/2019 [name] is a 49 y.o. female coming in with complaint of right shoulder pain. Injection did help but she is still in pain. Work can't accommodate restrictions. Pain is worse at night. Throbbing and sharp stabbing pains. Still painful all day just worse at night.   IMPRESSION: 1. Mild tendinosis of the supraspinatus tendon with a insertional interstitial tear. 2. Moderate tendinosis of the infraspinatus tendon. 3. 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 There has been significant interval enlargement compared with 06/29/2007, but the overall appearance appears  nonaggressive.      Past Medical History:  Diagnosis Date  . 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 FATIGUE 05/04/2009  . Headache(784.0)   . HYPOKALEMIA 10/17/2009  . LOW BLOOD PRESSURE 10/17/2009  . NIGHT SWEATS 05/04/2009  . Polydipsia 05/04/2009  . SHINGLES, HX OF 11/24/2008  . SINUSITIS, ACUTE 03/07/2009  . [place] SHOULDER REGION 11/09/2008   Past Surgical History:  Procedure Laterality Date  . EXTERNAL AUDITORY CANAL RECONSTRUCTION     tubes as a [place]  . TUBAL LIGATION  2007   Social History   Socioeconomic History  . Marital status: Divorced    Spouse name: Not on file  . Number of children: Not on file  . Years of education: Not on file  . Highest education level: Not on file  Occupational History  . Not on file  Tobacco Use  . Smoking status: Never Smoker  . Smokeless tobacco: Never Used  Substance and Sexual Activity  . Alcohol use: Not on file  . Drug use: Not on file  . Sexual activity: Not on file  Other Topics Concern  . Not on file  Social History Narrative   Works for [employer] but mostly lives by herself   Social Determinants of Radio broadcast assistant Strain:   . Difficulty of Paying Living Expenses:   Food Insecurity:   . Worried About Charity fundraiser in the Last Year:   . Arboriculturist in the Last Year:   Transportation Needs:   . Film/video editor (Medical):   Marland Kitchen Lack of Transportation (Non-Medical):   Physical Activity:   . Days of Exercise per Week:   .  Minutes of Exercise per Session:   Stress:   . Feeling of Stress :   Social Connections:   . Frequency of Communication with Friends and Family:   . Frequency of Social Gatherings with Friends and Family:   . Attends Religious Services:   . Active Member of Clubs or Organizations:   . Attends Archivist Meetings:   Marland Kitchen Marital Status:    No Known Allergies Family History  Problem Relation Age of Onset  . Arthritis Other   .  Hyperlipidemia Other   . Hypertension Other   . Cancer Other        lung  . Diabetes Other     Current Outpatient Medications (Endocrine & Metabolic):  .  predniSONE (DELTASONE) 50 MG tablet, 1 tablet by mouth daily   Current Outpatient Medications (Respiratory):  .  diphenhydrAMINE (BENADRYL) 25 MG tablet, Take 25 mg by mouth every 6 (six) hours as needed.  Current Outpatient Medications (Analgesics):  .  ibuprofen (ADVIL,MOTRIN) 200 MG tablet, Take 200 mg by mouth every 6 (six) hours as needed. .  SUMAtriptan (IMITREX) 100 MG tablet, Take 1 tablet (100 mg total) by mouth every 2 (two) hours as needed for migraine. May repeat in 2 hours if headache persists or recurs.  Current Outpatient Medications (Hematological):  .  ferrous sulfate (SLOW IRON) 160 (50 Fe) MG TBCR SR tablet, Take 1 tablet (160 mg total) by mouth daily.  Current Outpatient Medications (Other):  .  cyclobenzaprine (FLEXERIL) 5 MG tablet, Take 1 tablet (5 mg total) by mouth 3 (three) times daily as needed for muscle spasms. .  Diclofenac Sodium 2 % SOLN, Place 2 g onto the skin 2 (two) times daily. .  ondansetron (ZOFRAN ODT) 4 MG disintegrating tablet, Take 1 tablet (4 mg total) by mouth every 8 (eight) hours as needed for nausea or vomiting. .  Vitamin D, Ergocalciferol, (DRISDOL) 1.25 MG (50000 UT) CAPS capsule, Take 1 capsule (50,000 Units total) by mouth every 7 (seven) days.   Reviewed prior external information including notes and imaging from  primary care provider As well as notes that were available from care everywhere and other healthcare systems.  Past medical history, social, surgical and family history all reviewed in electronic medical record.  No pertanent information unless stated regarding to the chief complaint.   Review of Systems:  No headache, visual changes, nausea, vomiting, diarrhea, constipation, dizziness, abdominal pain, skin rash, fevers, chills, night sweats, weight loss, swollen  lymph nodes, body aches, joint swelling, chest pain, shortness of breath, mood changes. POSITIVE muscle aches  Objective  Blood pressure 100/70, pulse 75, height 5\' 7"  (1.702 m), weight 104 lb (47.2 kg), SpO2 98 %.   General: No apparent distress alert and oriented x3 mood and affect normal, dressed appropriately.  Patient is underweight HEENT: Pupils equal, extraocular movements intact  Respiratory: Patient's speak in full sentences and does not appear short of breath  Cardiovascular: No lower extremity edema, non tender, no erythema  Neuro: Cranial nerves II through XII are intact, neurovascularly intact in all extremities with 2+ DTRs and 2+ pulses.  Gait normal with good balance and coordination.  MSK:  Non tender with full range of motion and good stability and symmetric strength and tone of  elbows, wrist, hip, knee and ankles bilaterally.  Right shoulder exam shows positive impingement and positive crossover.  Patient is 5 out of 5 strength of the rotator cuff though noted.  No significant asymmetry of the musculature  when comparing to the contralateral side.  Procedure: Real-time Ultrasound Guided Injection of right glenohumeral joint Device: GE Logiq Q7  Ultrasound guided injection is preferred based studies that show increased duration, increased effect, greater accuracy, decreased procedural pain, increased response rate with ultrasound guided versus blind injection.  Verbal informed consent obtained.  Time-out conducted.  Noted no overlying erythema, induration, or other signs of local infection.  Skin prepped in a sterile fashion.  Local anesthesia: Topical Ethyl chloride.  With sterile technique and under real time ultrasound guidance:  Joint visualized.  23g 1  inch needle inserted posterior approach. Pictures taken for needle placement. Patient did have injection of 2 cc of 1% lidocaine, 2 cc of 0.5% Marcaine, and 1.0 cc of Kenalog 40 mg/dL. Completed without difficulty  Pain  immediately resolved suggesting accurate placement of the medication.  Advised to call if fevers/chills, erythema, induration, drainage, or persistent bleeding.  Images permanently stored and available for review in the ultrasound unit.  Impression: Technically successful ultrasound guided injection.  Procedure: Real-time Ultrasound Guided Injection of right acromioclavicular joint Device: GE Logiq Q7 Ultrasound guided injection is preferred based studies that show increased duration, increased effect, greater accuracy, decreased procedural pain, increased response rate, and decreased cost with ultrasound guided versus blind injection.  Verbal informed consent obtained.  Time-out conducted.  Noted no overlying erythema, induration, or other signs of local infection.  Skin prepped in a sterile fashion.  Local anesthesia: Topical Ethyl chloride.  With sterile technique and under real time ultrasound guidance: With a 25-gauge half inch needle injected with 0.5 cc of 0.5% Marcaine and 0.5 cc of Kenalog 40 mg/mL Completed without difficulty  Pain immediately resolved suggesting accurate placement of the medication.  Advised to call if fevers/chills, erythema, induration, drainage, or persistent bleeding.  Images permanently stored and available for review in the ultrasound unit.  Impression: Technically successful ultrasound guided injection.   Impression and Recommendations:     This case required medical decision making of moderate complexity. The above documentation has been reviewed and is accurate and complete Lyndal Pulley, DO       Note: This dictation was prepared with Dragon dictation along with smaller phrase technology. Any transcriptional errors that result from this process are unintentional.

## 2019-09-22 ENCOUNTER — Ambulatory Visit: Payer: Managed Care, Other (non HMO) | Admitting: Family Medicine

## 2019-10-24 ENCOUNTER — Telehealth: Payer: Self-pay | Admitting: Family Medicine

## 2019-10-24 NOTE — Telephone Encounter (Signed)
If she needs a letter fine but it appeared to be short term previously.

## 2019-10-24 NOTE — Telephone Encounter (Signed)
Patient called requesting a new work note changing her weight restrictions to 25 or 30 pounds.  Please advise. (she would like to pick it up today if possible)

## 2019-10-24 NOTE — Telephone Encounter (Signed)
Letter has been picked up 

## 2019-10-31 NOTE — Telephone Encounter (Signed)
Pt stated she needs a new letter for FMLA. It needs to state that she is okay to return back to work. She would like to pick it up no later than tomorrow.   They found a mass in her bones and referred her to [facility]. They told her it was non cancerous but they will have to monitor it for the rest of the year and has to go back in 6 months for an MRI.   Pt can be reached at [phone]

## 2019-11-01 NOTE — Telephone Encounter (Signed)
Pt scheduled for 11/01/19 at 2

## 2019-11-01 NOTE — Telephone Encounter (Signed)
Set up follow up.  I do not have update on her recent workup and cannot just release her without more information.

## 2019-11-01 NOTE — Telephone Encounter (Signed)
Please advise 

## 2019-11-02 ENCOUNTER — Ambulatory Visit (INDEPENDENT_AMBULATORY_CARE_PROVIDER_SITE_OTHER): Payer: Managed Care, Other (non HMO) | Admitting: Family Medicine

## 2019-11-02 ENCOUNTER — Ambulatory Visit: Payer: Managed Care, Other (non HMO) | Admitting: Family Medicine

## 2019-11-02 ENCOUNTER — Encounter: Payer: Self-pay | Admitting: Family Medicine

## 2019-11-02 ENCOUNTER — Other Ambulatory Visit: Payer: Self-pay

## 2019-11-02 VITALS — BP 110/62 | HR 76 | Temp 97.4°F | Wt 103.5 lb

## 2019-11-02 DIAGNOSIS — M25511 Pain in right shoulder: Secondary | ICD-10-CM | POA: Diagnosis not present

## 2019-11-02 DIAGNOSIS — G8929 Other chronic pain: Secondary | ICD-10-CM | POA: Diagnosis not present

## 2019-11-02 DIAGNOSIS — D1601 Benign neoplasm of scapula and long bones of right upper limb: Secondary | ICD-10-CM

## 2019-11-02 MED ORDER — CYCLOBENZAPRINE HCL 5 MG PO TABS: 5.0000 mg | ORAL_TABLET | Freq: Three times a day (TID) | ORAL | 5 refills | Status: DC | PRN

## 2019-11-02 NOTE — Progress Notes (Signed)
Established Patient Office Visit  Subjective:  Patient ID: [name], female    DOB: 30-Mar-1971  Age: 49 y.o. MRN: [id number]  CC:  Chief Complaint  Patient presents with  . Follow-up    HPI [name] presents for request for return to work note.  She has been out of work for several months.  She had Covid back in the winter and had prolonged recovery from that.  She had some persistent dyspnea which is slowly improving.  She had significant shoulder difficulties.  She was referred to sports medicine and had ultrasound and eventually MRI which showed tendinopathy of the right supraspinatus tendon and partial tear.  She eventually was referred to orthopedist.  She also had evidence of enchondroma which have been noted on previous x-rays 2009 but slowly increasing in size.  She was eventually referred to orthopedic specialist at [facility] and they have recommended ongoing surveillance but no further intervention at this time.  Her enchondroma had benign features.  She has had several steroid injections in her right shoulder and right shoulder pain is gradually improving.  She has been released to go back to work from orthopedic standpoint with restriction of no lifting greater than 25 pounds.  She has lost a few pounds since she was here last.  She is trying to gain the weight back.  Her appetite is fair.  Past Medical History:  Diagnosis Date  . 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 FATIGUE 05/04/2009  . Headache(784.0)   . HYPOKALEMIA 10/17/2009  . LOW BLOOD PRESSURE 10/17/2009  . NIGHT SWEATS 05/04/2009  . Polydipsia 05/04/2009  . SHINGLES, HX OF 11/24/2008  . SINUSITIS, ACUTE 03/07/2009  . [place] SHOULDER REGION 11/09/2008    Past Surgical History:  Procedure Laterality Date  . EXTERNAL AUDITORY CANAL RECONSTRUCTION     tubes as a [place]  . TUBAL LIGATION  2007    Family History  Problem Relation Age of Onset  . Arthritis Other   . Hyperlipidemia Other   .  Hypertension Other   . Cancer Other        lung  . Diabetes Other     Social History   Socioeconomic History  . Marital status: Divorced    Spouse name: Not on file  . Number of children: Not on file  . Years of education: Not on file  . Highest education level: Not on file  Occupational History  . Not on file  Tobacco Use  . Smoking status: Never Smoker  . Smokeless tobacco: Never Used  Vaping Use  . Vaping Use: Never used  Substance and Sexual Activity  . Alcohol use: Not on file  . Drug use: Not on file  . Sexual activity: Not on file  Other Topics Concern  . Not on file  Social History Narrative   Works for [employer] but mostly lives by herself   Social Determinants of Radio broadcast assistant Strain:   . Difficulty of Paying Living Expenses:   Food Insecurity:   . Worried About Charity fundraiser in the Last Year:   . Arboriculturist in the Last Year:   Transportation Needs:   . Film/video editor (Medical):   Marland Kitchen Lack of Transportation (Non-Medical):   Physical Activity:   . Days of Exercise per Week:   . Minutes of Exercise per Session:   Stress:   . Feeling of Stress :  Social Connections:   . Frequency of Communication with Friends and Family:   . Frequency of Social Gatherings with Friends and Family:   . Attends Religious Services:   . Active Member of Clubs or Organizations:   . Attends Archivist Meetings:   Marland Kitchen Marital Status:   Intimate Partner Violence:   . Fear of Current or Ex-Partner:   . Emotionally Abused:   Marland Kitchen Physically Abused:   . Sexually Abused:     Outpatient Medications Prior to Visit  Medication Sig Dispense Refill  . Diclofenac Sodium 2 % SOLN Place 2 g onto the skin 2 (two) times daily. 112 g 3  . diphenhydrAMINE (BENADRYL) 25 MG tablet Take 25 mg by mouth every 6 (six) hours as needed.    . ferrous sulfate (SLOW IRON) 160 (50 Fe) MG TBCR SR tablet Take 1 tablet (160 mg total) by mouth  daily. 30 each 5  . ibuprofen (ADVIL,MOTRIN) 200 MG tablet Take 200 mg by mouth every 6 (six) hours as needed.    . ondansetron (ZOFRAN ODT) 4 MG disintegrating tablet Take 1 tablet (4 mg total) by mouth every 8 (eight) hours as needed for nausea or vomiting. 12 tablet 0  . predniSONE (DELTASONE) 50 MG tablet 1 tablet by mouth daily 5 tablet 0  . SUMAtriptan (IMITREX) 100 MG tablet Take 1 tablet (100 mg total) by mouth every 2 (two) hours as needed for migraine. May repeat in 2 hours if headache persists or recurs. 10 tablet 3  . Vitamin D, Ergocalciferol, (DRISDOL) 1.25 MG (50000 UT) CAPS capsule Take 1 capsule (50,000 Units total) by mouth every 7 (seven) days. 12 capsule 1  . cyclobenzaprine (FLEXERIL) 5 MG tablet Take 1 tablet (5 mg total) by mouth 3 (three) times daily as needed for muscle spasms. 30 tablet 5   No facility-administered medications prior to visit.    No Known Allergies  ROS Review of Systems  Constitutional: Negative for chills and fever.  Respiratory: Negative for cough and shortness of breath.   Cardiovascular: Negative for chest pain.  Hematological: Negative for adenopathy.      Objective:    Physical Exam Vitals reviewed.  Constitutional:      Comments: Alert, thin 49 year old female in no distress  Cardiovascular:     Rate and Rhythm: Normal rate and regular rhythm.  Pulmonary:     Effort: Pulmonary effort is normal.     Breath sounds: Normal breath sounds. No wheezing or rales.  Musculoskeletal:     Cervical back: Neck supple.     Comments: Right shoulder reveals good range of motion     BP 110/62 (BP Location: Left Arm, Patient Position: Sitting, Cuff Size: Normal)   Pulse 76   Temp (!) 97.4 F (36.3 C) (Oral)   Wt 103 lb 8 oz (46.9 kg)   SpO2 95%   BMI 16.21 kg/m  Wt Readings from Last 3 Encounters:  11/02/19 103 lb 8 oz (46.9 kg)  08/26/19 104 lb (47.2 kg)  07/13/19 105 lb (47.6 kg)     Health Maintenance Due  Topic Date Due  .  COVID-19 Vaccine (1) Never done  . TETANUS/TDAP  Never done  . PAP SMEAR-Modifier  12/18/2018    There are no preventive care reminders to display for this patient.  Lab Results  Component Value Date   TSH 1.44 06/22/2015   Lab Results  Component Value Date   WBC 6.3 06/16/2019   HGB 11.9 (L) 06/16/2019  HCT 35.3 (L) 06/16/2019   MCV 98.1 06/16/2019   PLT 284.0 06/16/2019   Lab Results  Component Value Date   NA 139 11/26/2016   K 3.7 11/26/2016   CO2 30 11/26/2016   GLUCOSE 144 (H) 11/26/2016   BUN 15 11/26/2016   CREATININE 0.77 11/26/2016   BILITOT 0.4 11/26/2016   ALKPHOS 68 11/26/2016   AST 14 11/26/2016   ALT 10 11/26/2016   PROT 6.4 11/26/2016   ALBUMIN 4.3 11/26/2016   CALCIUM 9.1 11/26/2016   GFR 86.14 11/26/2016   Lab Results  Component Value Date   CHOL 107 01/04/2011   Lab Results  Component Value Date   HDL 38 (L) 01/04/2011   Lab Results  Component Value Date   LDLCALC 55 01/04/2011   Lab Results  Component Value Date   TRIG 68 01/04/2011   Lab Results  Component Value Date   CHOLHDL 2.8 01/04/2011   Lab Results  Component Value Date   HGBA1C 5.6 01/03/2011      Assessment & Plan:   Problem List Items Addressed This Visit      Unprioritized   Enchondroma of humerus, right - Primary   Right shoulder pain (Chronic)    -Patient was given release to work note for 11/02/2019.  Restriction of no lifting over 25 pounds.  She plans to continue close follow-up with sports medicine -She requested refill of Flexeril and this was provided as below  Meds ordered this encounter  Medications  . cyclobenzaprine (FLEXERIL) 5 MG tablet    Sig: Take 1 tablet (5 mg total) by mouth 3 (three) times daily as needed for muscle spasms.    Dispense:  30 tablet    Refill:  5    Follow-up: No follow-ups on file.    Carolann Littler, MD

## 2019-11-23 ENCOUNTER — Ambulatory Visit (INDEPENDENT_AMBULATORY_CARE_PROVIDER_SITE_OTHER): Payer: Managed Care, Other (non HMO) | Admitting: Family Medicine

## 2019-11-23 ENCOUNTER — Other Ambulatory Visit: Payer: Self-pay

## 2019-11-23 ENCOUNTER — Ambulatory Visit (INDEPENDENT_AMBULATORY_CARE_PROVIDER_SITE_OTHER): Payer: Managed Care, Other (non HMO)

## 2019-11-23 VITALS — BP 118/70 | HR 68 | Ht 67.0 in | Wt 102.0 lb

## 2019-11-23 DIAGNOSIS — M2550 Pain in unspecified joint: Secondary | ICD-10-CM

## 2019-11-23 DIAGNOSIS — M25512 Pain in left shoulder: Secondary | ICD-10-CM

## 2019-11-23 DIAGNOSIS — M542 Cervicalgia: Secondary | ICD-10-CM | POA: Diagnosis not present

## 2019-11-23 DIAGNOSIS — D1601 Benign neoplasm of scapula and long bones of right upper limb: Secondary | ICD-10-CM

## 2019-11-23 MED ORDER — MELOXICAM 7.5 MG PO TABS: 7.5000 mg | ORAL_TABLET | Freq: Every day | ORAL | 0 refills | Status: DC

## 2019-11-23 NOTE — Assessment & Plan Note (Signed)
Acute onset of left shoulder pain.  Radiation down the arm that makes it sound more like a potential cervical radiculopathy.  Patient is highly anxious and concerned for potential heart issue.  Patient is not having any true chest pain.  Regular rate and rhythm noted today.  Highly unlikely but we discussed worsening symptoms to seek medical attention immediately.  Laboratory work-up including troponin more secondary to help her with some of the anxiety.  We discussed the potential for potentially trigger point injections but instead we will monitor with patient not having significant pain in the shoulder.  Meloxicam given for some of the different OT.  Warned of potential side effects for long term use.  Follow-up again in 4 to 6 weeks

## 2019-11-23 NOTE — Patient Instructions (Addendum)
Xray shoulder and neck Labs today Meloxicam MM relaxer nightly If symptoms worsen seek medication attention immediately

## 2019-11-23 NOTE — Progress Notes (Signed)
[place] Phone: [phone] Subjective:   [name], am serving as a scribe for Dr. Hulan Saas. This visit occurred during the SARS-CoV-2 public health emergency.  Safety protocols were in place, including screening questions prior to the visit, additional usage of staff PPE, and extensive cleaning of exam room while observing appropriate contact time as indicated for disinfecting solutions.   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  When comparing to 2009 imaging patient has had some enlargement.  Cortical area of the bone though appears to be unrelated at this time.  Patient though does want a second opinion and will refer patient to orthopedic surgery to discuss.   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  Injection given today as well as an acromioclavicular injection to try to help with some of the pain control.  Patient's MRI did show an enchondroma and patient wants second opinion to make sure conservative therapy is the right answer.  Will refer today.  Discussed icing regimen, proper lifting mechanics.  Encouraged muscle strengthening.  Patient has done rehabilitation multiple times.  Follow-up with me otherwise in 3 months   Update 11/23/2019 [name] is a 49 y.o. female coming in with complaint of right shoulder pain. Patient states that prednisone has helped to reduce her pain.   Now having left shoulder pain. Pain for past 3 days. Pain in shoulder joint that this sharp and shoots down her arm. Pain comes and goes. Pain especially at night.    MRI of the right shoulder Wednesday, May 20 21.  MRI did show some mild tendinosis of the supraspinatus tendon and the infraspinatus tendons but he was found to have an  enchondroma with mild enlargement over the course of 12 years.  Past Medical History:  Diagnosis Date  . 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 FATIGUE 05/04/2009  . Headache(784.0)   . HYPOKALEMIA 10/17/2009  . LOW BLOOD PRESSURE 10/17/2009  . NIGHT SWEATS 05/04/2009  . Polydipsia 05/04/2009  . SHINGLES, HX OF 11/24/2008  . SINUSITIS, ACUTE 03/07/2009  . [place] SHOULDER REGION 11/09/2008   Past Surgical History:  Procedure Laterality Date  . EXTERNAL AUDITORY CANAL RECONSTRUCTION     tubes as a [place]  . TUBAL LIGATION  2007   Social History   Socioeconomic History  . Marital status: Divorced    Spouse name: Not on file  . Number of children: Not on file  . Years of education: Not on file  . Highest education level: Not on file  Occupational History  . Not on file  Tobacco Use  . Smoking status: Never Smoker  . Smokeless tobacco: Never Used  Vaping Use  . Vaping Use: Never used  Substance and Sexual Activity  . Alcohol use: Not on file  . Drug use: Not on file  . Sexual activity: Not on file  Other Topics Concern  . Not on file  Social History Narrative   Works for [employer] of 2 but mostly lives by herself   Social Determinants of Radio broadcast assistant Strain:   . Difficulty of Paying Living Expenses:   Food Insecurity:   . Worried About Charity fundraiser in the  Last Year:   . [place] in the Last Year:   Transportation Needs:   . Film/video editor (Medical):   Marland Kitchen Lack of Transportation (Non-Medical):   Physical Activity:   . Days of Exercise per Week:   . Minutes of Exercise per Session:   Stress:   . Feeling of Stress :   Social Connections:   . Frequency of Communication with Friends and Family:   . Frequency of Social Gatherings with Friends and Family:   . Attends Religious Services:   . Active Member of Clubs or Organizations:   . Attends Archivist Meetings:   Marland Kitchen Marital Status:    [name] Known  Allergies Family History  Problem Relation Age of Onset  . Arthritis Other   . Hyperlipidemia Other   . Hypertension Other   . Cancer Other        lung  . Diabetes Other       Current Outpatient Medications (Respiratory):  .  diphenhydrAMINE (BENADRYL) 25 MG tablet, Take 25 mg by mouth every 6 (six) hours as needed.  Current Outpatient Medications (Analgesics):  .  ibuprofen (ADVIL,MOTRIN) 200 MG tablet, Take 200 mg by mouth every 6 (six) hours as needed. .  SUMAtriptan (IMITREX) 100 MG tablet, Take 1 tablet (100 mg total) by mouth every 2 (two) hours as needed for migraine. May repeat in 2 hours if headache persists or recurs. .  meloxicam (MOBIC) 7.5 MG tablet, Take 1 tablet (7.5 mg total) by mouth daily.  Current Outpatient Medications (Hematological):  .  ferrous sulfate (SLOW IRON) 160 (50 Fe) MG TBCR SR tablet, Take 1 tablet (160 mg total) by mouth daily.  Current Outpatient Medications (Other):  .  cyclobenzaprine (FLEXERIL) 5 MG tablet, Take 1 tablet (5 mg total) by mouth 3 (three) times daily as needed for muscle spasms. .  Diclofenac Sodium 2 % SOLN, Place 2 g onto the skin 2 (two) times daily. .  ondansetron (ZOFRAN ODT) 4 MG disintegrating tablet, Take 1 tablet (4 mg total) by mouth every 8 (eight) hours as needed for nausea or vomiting. .  Vitamin D, Ergocalciferol, (DRISDOL) 1.25 MG (50000 UT) CAPS capsule, Take 1 capsule (50,000 Units total) by mouth every 7 (seven) days.   Reviewed prior external information including notes and imaging from  primary care provider As well as notes that were available from care everywhere and other healthcare systems.  Past medical history, social, surgical and family history all reviewed in electronic medical record.  [name] pertanent information unless stated regarding to the chief complaint.   Review of Systems:  [name] headache, visual changes, nausea, vomiting, diarrhea, constipation, dizziness, abdominal pain, skin rash, fevers,  chills, night sweats, weight loss, swollen lymph nodes, body aches, joint swelling, chest pain, shortness of breath, mood changes. POSITIVE muscle aches  Objective  Blood pressure 118/70, pulse 68, height 5\' 7"  (1.702 m), weight 102 lb (46.3 kg), SpO2 97 %.   General: [name] apparent distress alert and oriented x3 mood and affect normal, dressed appropriately.  Patient is very anxious at baseline HEENT: Pupils equal, extraocular movements intact  Respiratory: Patient's speak in full sentences and does not appear short of breath  Cardiovascular: [name] lower extremity edema, non tender, [name] erythema , regular rate and rhythm Neuro: Cranial nerves II through XII are intact, neurovascularly intact in all extremities with 2+ DTRs and 2+ pulses.  Gait normal with good balance and coordination.  MSK: Right shoulder seems to be unremarkable.  5 out of 5 strength.  Left shoulder seems to also be unremarkable with very minimal impingement signs noted with Hawkins.  Patient does have some trigger points in the upper trapezius muscle.  Patient's neck minorly tight.  Negative Spurling's,    Impression and Recommendations:     The above documentation has been reviewed and is accurate and complete Lyndal Pulley, DO       Note: This dictation was prepared with Dragon dictation along with smaller phrase technology. Any transcriptional errors that result from this process are unintentional.

## 2019-11-24 ENCOUNTER — Encounter: Payer: Self-pay | Admitting: Family Medicine

## 2019-11-24 LAB — COMPREHENSIVE METABOLIC PANEL
AG Ratio: 1.5 (calc) (ref 1.0–2.5)
ALT: 9 U/L (ref 6–29)
AST: 18 U/L (ref 10–35)
Albumin: 4 g/dL (ref 3.6–5.1)
Alkaline phosphatase (APISO): 73 U/L (ref 31–125)
BUN: 11 mg/dL (ref 7–25)
CO2: 26 mmol/L (ref 20–32)
Calcium: 8.5 mg/dL — ABNORMAL LOW (ref 8.6–10.2)
Chloride: 102 mmol/L (ref 98–110)
Creat: 0.83 mg/dL (ref 0.50–1.10)
Globulin: 2.6 g/dL (calc) (ref 1.9–3.7)
Glucose, Bld: 64 mg/dL — ABNORMAL LOW (ref 65–99)
Potassium: 3.7 mmol/L (ref 3.5–5.3)
Sodium: 139 mmol/L (ref 135–146)
Total Bilirubin: 0.4 mg/dL (ref 0.2–1.2)
Total Protein: 6.6 g/dL (ref 6.1–8.1)

## 2019-11-24 LAB — CBC WITH DIFFERENTIAL/PLATELET
Absolute Monocytes: 473 cells/uL (ref 200–950)
Basophils Absolute: 63 cells/uL (ref 0–200)
Basophils Relative: 1 %
Eosinophils Absolute: 50 cells/uL (ref 15–500)
Eosinophils Relative: 0.8 %
HCT: 38.7 % (ref 35.0–45.0)
Hemoglobin: 13.6 g/dL (ref 11.7–15.5)
Lymphs Abs: 2111 cells/uL (ref 850–3900)
MCH: 35.7 pg — ABNORMAL HIGH (ref 27.0–33.0)
MCHC: 35.1 g/dL (ref 32.0–36.0)
MCV: 101.6 fL — ABNORMAL HIGH (ref 80.0–100.0)
MPV: 10 fL (ref 7.5–12.5)
Monocytes Relative: 7.5 %
Neutro Abs: 3604 cells/uL (ref 1500–7800)
Neutrophils Relative %: 57.2 %
Platelets: 286 10*3/uL (ref 140–400)
RBC: 3.81 10*6/uL (ref 3.80–5.10)
RDW: 11.8 % (ref 11.0–15.0)
Total Lymphocyte: 33.5 %
WBC: 6.3 10*3/uL (ref 3.8–10.8)

## 2019-11-24 LAB — TROPONIN I: Troponin I: <3 ng/L (ref <=47)

## 2019-11-24 LAB — BRAIN NATRIURETIC PEPTIDE: Brain Natriuretic Peptide: 26 pg/mL (ref <100)

## 2019-11-24 LAB — SEDIMENTATION RATE: Sed Rate: 9 mm/h (ref 0–20)

## 2019-11-25 ENCOUNTER — Ambulatory Visit: Payer: Managed Care, Other (non HMO) | Admitting: Family Medicine

## 2020-01-04 ENCOUNTER — Encounter: Payer: Self-pay | Admitting: Family Medicine

## 2020-01-04 ENCOUNTER — Ambulatory Visit (INDEPENDENT_AMBULATORY_CARE_PROVIDER_SITE_OTHER): Payer: Managed Care, Other (non HMO) | Admitting: Family Medicine

## 2020-01-04 ENCOUNTER — Ambulatory Visit: Payer: Self-pay

## 2020-01-04 VITALS — BP 112/72 | HR 80 | Ht 67.0 in | Wt 102.0 lb

## 2020-01-04 DIAGNOSIS — M25512 Pain in left shoulder: Secondary | ICD-10-CM | POA: Diagnosis not present

## 2020-01-04 DIAGNOSIS — D509 Iron deficiency anemia, unspecified: Secondary | ICD-10-CM

## 2020-01-04 DIAGNOSIS — G8929 Other chronic pain: Secondary | ICD-10-CM | POA: Diagnosis not present

## 2020-01-04 NOTE — Assessment & Plan Note (Signed)
Left shoulder pain seems to be out of proportion.  Ultrasound does show a consistent findings that could be suggestive of a rotator cuff tear but does also have a reactive bursitis which makes it difficult for the diagnosis.  Patient has significant anxiety at the moment and secondary to finding endochondroma on the right side patient would like further evaluation with advanced imaging.  Patient would be a potential surgical candidate so I think that is fine for now.  We will get the MR arthrogram to further evaluate and discuss management either surgical or injection.

## 2020-01-04 NOTE — Progress Notes (Signed)
[name] [place] Phone: [phone] Subjective:   [name], am serving as a scribe for Dr. Hulan Saas. This visit occurred during the SARS-CoV-2 public health emergency.  Safety protocols were in place, including screening questions prior to the visit, additional usage of staff PPE, and extensive cleaning of exam room while observing appropriate contact time as indicated for disinfecting solutions.   I'm seeing this patient by the request  of:  Eulas Post, MD  CC: Left shoulder pain  NID:[id number]   11/23/2019 Acute onset of left shoulder pain.  Radiation down the arm that makes it sound more like a potential cervical radiculopathy.  Patient is highly anxious and concerned for potential heart issue.  Patient is not having any true chest pain.  Regular rate and rhythm noted today.  Highly unlikely but we discussed worsening symptoms to seek medical attention immediately.  Laboratory work-up including troponin more secondary to help her with some of the anxiety.  We discussed the potential for potentially trigger point injections but instead we will monitor with patient not having significant pain in the shoulder.  Meloxicam given for some of the different OT.  Warned of potential side effects for long term use.  Follow-up again in 4 to 6 weeks  Update 01/04/2020 [name] is a 49 y.o. female coming in with complaint of left shoulder pain. Patient states that she has been having sharp pain with movement in left shoulder over deltoid. Pain radiates into elbow and wrist on left side. States that this symptom has been going on for one month. Is taking Meloxicam.  Patient says that the pain is much more localized in the shoulder than it was before.  Now only associated with more weakness.  Patient states she is unable to pick up anything greater than 5 pounds she feels like at the side.  Patient is very concerned as  well that she may have a endochondroma on the contralateral side as well now.  Patient feels just something is completely wrong.    Xray 11/23/2019 IMPRESSION: 1. Minimal glenohumeral joint space narrowing compatible with osteoarthritis. [name] acute bony abnormality.  IMPRESSION: 1. Mild lower cervical spondylosis.  Past Medical History:  Diagnosis Date   ECZEMA 11/24/2008   FATIGUE 05/04/2009   Headache(784.0)    HYPOKALEMIA 10/17/2009   LOW BLOOD PRESSURE 10/17/2009   NIGHT SWEATS 05/04/2009   Polydipsia 05/04/2009   SHINGLES, HX OF 11/24/2008   SINUSITIS, ACUTE 03/07/2009   UNSPEC DISORDERS [place] SHOULDER REGION 11/09/2008   Past Surgical History:  Procedure Laterality Date   EXTERNAL AUDITORY CANAL RECONSTRUCTION     tubes as a [place]   TUBAL LIGATION  2007   Social History   Socioeconomic History   Marital status: Divorced    Spouse name: Not on file   Number of children: Not on file   Years of education: Not on file   Highest education level: Not on file  Occupational History   Not on file  Tobacco Use   Smoking status: Never Smoker   Smokeless tobacco: Never Used  Vaping Use   Vaping Use: Never used  Substance and Sexual Activity   Alcohol use: Not on file   Drug use: Not on file   Sexual activity: Not on file  Other Topics Concern   Not on file  Social History Narrative   Works for [employer]       household of 2 but  mostly lives by herself   Social Determinants of Health   Financial Resource Strain:    Difficulty of Paying Living Expenses: Not on file  Food Insecurity:    Worried About Charity fundraiser in the Last Year: Not on file   [employer] of Food in the Last Year: Not on file  Transportation Needs:    Lack of Transportation (Medical): Not on file   Lack of Transportation (Non-Medical): Not on file  Physical Activity:    Days of Exercise per Week: Not on file   Minutes of Exercise per Session: Not on file  Stress:     Feeling of Stress : Not on file  Social Connections:    Frequency of Communication with Friends and Family: Not on file   Frequency of Social Gatherings with Friends and Family: Not on file   Attends Religious Services: Not on file   Active Member of [place] or Organizations: Not on file   Attends Archivist Meetings: Not on file   Marital Status: Not on file   [name] Known Allergies Family History  Problem Relation Age of Onset   Arthritis Other    Hyperlipidemia Other    Hypertension Other    Cancer Other        lung   Diabetes Other       Current Outpatient Medications (Respiratory):    diphenhydrAMINE (BENADRYL) 25 MG tablet, Take 25 mg by mouth every 6 (six) hours as needed.  Current Outpatient Medications (Analgesics):    ibuprofen (ADVIL,MOTRIN) 200 MG tablet, Take 200 mg by mouth every 6 (six) hours as needed.   meloxicam (MOBIC) 7.5 MG tablet, Take 1 tablet (7.5 mg total) by mouth daily.   SUMAtriptan (IMITREX) 100 MG tablet, Take 1 tablet (100 mg total) by mouth every 2 (two) hours as needed for migraine. May repeat in 2 hours if headache persists or recurs.  Current Outpatient Medications (Hematological):    ferrous sulfate (SLOW IRON) 160 (50 Fe) MG TBCR SR tablet, Take 1 tablet (160 mg total) by mouth daily.  Current Outpatient Medications (Other):    cyclobenzaprine (FLEXERIL) 5 MG tablet, Take 1 tablet (5 mg total) by mouth 3 (three) times daily as needed for muscle spasms.   Diclofenac Sodium 2 % SOLN, Place 2 g onto the skin 2 (two) times daily.   ondansetron (ZOFRAN ODT) 4 MG disintegrating tablet, Take 1 tablet (4 mg total) by mouth every 8 (eight) hours as needed for nausea or vomiting.   Vitamin D, Ergocalciferol, (DRISDOL) 1.25 MG (50000 UT) CAPS capsule, Take 1 capsule (50,000 Units total) by mouth every 7 (seven) days.   Reviewed prior external information including notes and imaging from  primary care provider As well as  notes that were available from care everywhere and other healthcare systems.  Past medical history, social, surgical and family history all reviewed in electronic medical record.  [name] pertanent information unless stated regarding to the chief complaint.   Review of Systems:  [name] headache, visual changes, nausea, vomiting, diarrhea, constipation, dizziness, abdominal pain, skin rash, fevers, chills, night sweats, weight loss, swollen lymph nodes, body aches, joint swelling, chest pain, shortness of breath, mood changes. POSITIVE muscle aches  Objective  Blood pressure 112/72, pulse 80, height 5\' 7"  (1.702 m), weight 102 lb (46.3 kg), SpO2 98 %.   General: Cachectic very anxious HEENT: Pupils equal, extraocular movements intact  Respiratory: Patient's speak in full sentences and does not appear short of breath  Cardiovascular: [name] lower extremity edema, non tender, [name] erythema  Neuro: Cranial nerves II through XII are intact, neurovascularly intact in all extremities with 2+ DTRs and 2+ pulses.   MSK: Patient is diffusely tender in multiple different areas that seems to be out of proportion to the amount of palpation.  Left shoulder exam is tender to palpation diffusely.  Positive impingement with Neer's and Hawkins as well as external rotation.  Patient is voluntary guarding which makes strength testing difficult.  Limited musculoskeletal ultrasound was performed and interpreted by Lyndal Pulley  Limited ultrasound of patient's shoulder shows that patient does have some potentially a tear noted of the supraspinatus with very minimal retraction intersubstance.  Patient also has what appears to be a reactive bursitis noted.  Mild narrowing of the glenohumeral joint and narrowing of the acromioclavicular joint noted.  Impression: Mild arthritic changes with questionable supraspinatus tear   Impression and Recommendations:     The above documentation has been reviewed and is accurate and  complete Lyndal Pulley, DO       Note: This dictation was prepared with Dragon dictation along with smaller phrase technology. Any transcriptional errors that result from this process are unintentional.

## 2020-01-04 NOTE — Patient Instructions (Addendum)
MRI [facility] [phone] We will be in touch following results B12 1050mcg daily

## 2020-01-04 NOTE — Assessment & Plan Note (Signed)
Most recent labs do show some mild mixed anemia noted with B12 likely low with an elevated MCV and hemoglobin of 11.9.

## 2020-01-05 ENCOUNTER — Ambulatory Visit: Payer: Managed Care, Other (non HMO) | Admitting: Family Medicine

## 2020-01-26 ENCOUNTER — Inpatient Hospital Stay: Admission: RE | Admit: 2020-01-26 | Payer: Managed Care, Other (non HMO) | Source: Ambulatory Visit

## 2020-01-26 ENCOUNTER — Other Ambulatory Visit: Payer: Managed Care, Other (non HMO)

## 2020-02-20 ENCOUNTER — Other Ambulatory Visit: Payer: Self-pay | Admitting: Obstetrics and Gynecology

## 2020-02-20 DIAGNOSIS — Z1231 Encounter for screening mammogram for malignant neoplasm of breast: Secondary | ICD-10-CM

## 2020-02-21 ENCOUNTER — Other Ambulatory Visit: Payer: Self-pay

## 2020-02-21 ENCOUNTER — Ambulatory Visit
Admission: RE | Admit: 2020-02-21 | Discharge: 2020-02-21 | Disposition: A | Discharge status: Home / Self Care | Payer: Managed Care, Other (non HMO) | Source: Ambulatory Visit | Attending: Obstetrics and Gynecology | Admitting: Obstetrics and Gynecology

## 2020-02-21 DIAGNOSIS — Z1231 Encounter for screening mammogram for malignant neoplasm of breast: Secondary | ICD-10-CM

## 2020-03-14 DIAGNOSIS — R092 Respiratory arrest: Secondary | ICD-10-CM

## 2020-03-14 HISTORY — DX: Respiratory arrest: R09.2

## 2020-04-20 ENCOUNTER — Telehealth (INDEPENDENT_AMBULATORY_CARE_PROVIDER_SITE_OTHER): Payer: Managed Care, Other (non HMO) | Admitting: Family Medicine

## 2020-04-20 DIAGNOSIS — R634 Abnormal weight loss: Secondary | ICD-10-CM | POA: Insufficient documentation

## 2020-04-20 DIAGNOSIS — R194 Change in bowel habit: Secondary | ICD-10-CM | POA: Insufficient documentation

## 2020-04-20 DIAGNOSIS — R55 Syncope and collapse: Secondary | ICD-10-CM

## 2020-04-20 DIAGNOSIS — R0989 Other specified symptoms and signs involving the circulatory and respiratory systems: Secondary | ICD-10-CM

## 2020-04-20 DIAGNOSIS — R152 Fecal urgency: Secondary | ICD-10-CM | POA: Insufficient documentation

## 2020-04-20 DIAGNOSIS — R140 Abdominal distension (gaseous): Secondary | ICD-10-CM | POA: Insufficient documentation

## 2020-04-20 DIAGNOSIS — R1011 Right upper quadrant pain: Secondary | ICD-10-CM | POA: Insufficient documentation

## 2020-04-20 DIAGNOSIS — Z1211 Encounter for screening for malignant neoplasm of colon: Secondary | ICD-10-CM | POA: Insufficient documentation

## 2020-04-20 MED ORDER — PREDNISONE 20 MG PO TABS: ORAL_TABLET | ORAL | 0 refills | Status: DC

## 2020-04-20 MED ORDER — DOXYCYCLINE HYCLATE 100 MG PO CAPS: 100.0000 mg | ORAL_CAPSULE | Freq: Two times a day (BID) | ORAL | 0 refills | Status: DC
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
## 2020-07-06 ENCOUNTER — Other Ambulatory Visit: Payer: Self-pay | Admitting: Family Medicine

## 2020-07-06 MED ORDER — ONDANSETRON 4 MG PO TBDP: 4.0000 mg | ORAL_TABLET | Freq: Three times a day (TID) | ORAL | 0 refills | Status: DC | PRN

## 2020-07-06 NOTE — Telephone Encounter (Signed)
Refill sent.

## 2020-09-03 ENCOUNTER — Other Ambulatory Visit: Payer: Self-pay

## 2020-09-04 ENCOUNTER — Encounter: Payer: Self-pay | Admitting: Family Medicine

## 2020-09-04 ENCOUNTER — Ambulatory Visit (INDEPENDENT_AMBULATORY_CARE_PROVIDER_SITE_OTHER): Payer: Managed Care, Other (non HMO) | Admitting: Family Medicine

## 2020-09-04 VITALS — BP 120/60 | HR 73 | Temp 97.9°F | Wt 105.8 lb

## 2020-09-04 DIAGNOSIS — R600 Localized edema: Secondary | ICD-10-CM | POA: Diagnosis not present

## 2020-09-04 MED ORDER — APIXABAN 5 MG PO TABS: 5.0000 mg | ORAL_TABLET | Freq: Two times a day (BID) | ORAL | 0 refills | Status: DC

## 2020-09-04 NOTE — Progress Notes (Signed)
Established Patient Office Visit  Subjective:  Patient ID: [name], female    DOB: May 22, 1970  Age: 50 y.o. MRN: [id number]  CC:  Chief Complaint  Patient presents with  . Edema    R leg, x 3 days, very painful and tight    HPI [name] presents for acute and new problem of right lower extremity edema.  She first noticed this Sunday around 11 AM.  Denies any injury.  She does have some discomfort involving her calf and distal thigh region.  She noted that this skin felt "tight ".  Denies any dyspnea.  No chest pains.  No pleuritic pain.  No recent prolonged travels.  No hormone use.  Non-smoker.  No prior history of DVT.  No known family history of DVT.  She has been elevating legs frequently.  She states edema is actually slightly improved today compared with yesterday.  Denies any recent fever, chills, appetite, or weight changes.  She did have COVID last year and has had some persistent fatigue since then.  No recent cough.  Past Medical History:  Diagnosis Date  . 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 FATIGUE 05/04/2009  . Headache(784.0)   . HYPOKALEMIA 10/17/2009  . LOW BLOOD PRESSURE 10/17/2009  . NIGHT SWEATS 05/04/2009  . Polydipsia 05/04/2009  . SHINGLES, HX OF 11/24/2008  . SINUSITIS, ACUTE 03/07/2009  . [place] SHOULDER REGION 11/09/2008    Past Surgical History:  Procedure Laterality Date  . BREAST BIOPSY Left 08/2019  . EXTERNAL AUDITORY CANAL RECONSTRUCTION     tubes as a [place]  . TUBAL LIGATION  2007    Family History  Problem Relation Age of Onset  . Arthritis Other   . Hyperlipidemia Other   . Hypertension Other   . Cancer Other        lung  . Diabetes Other     Social History   Socioeconomic History  . Marital status: Divorced    Spouse name: Not on file  . Number of children: Not on file  . Years of education: Not on file  . Highest education level: Not on file  Occupational History  . Not on file  Tobacco Use  . Smoking  status: Never Smoker  . Smokeless tobacco: Never Used  Vaping Use  . Vaping Use: Never used  Substance and Sexual Activity  . Alcohol use: Not on file  . Drug use: Not on file  . Sexual activity: Not on file  Other Topics Concern  . Not on file  Social History Narrative   Works for [employer] but mostly lives by herself   Social Determinants of Radio broadcast assistant Strain: Not on file  Food Insecurity: Not on file  Transportation Needs: Not on file  Physical Activity: Not on file  Stress: Not on file  Social Connections: Not on file  Intimate Partner Violence: Not on file    Outpatient Medications Prior to Visit  Medication Sig Dispense Refill  . cyclobenzaprine (FLEXERIL) 5 MG tablet Take 1 tablet (5 mg total) by mouth 3 (three) times daily as needed for muscle spasms. 30 tablet 5  . Diclofenac Sodium 2 % SOLN Place 2 g onto the skin 2 (two) times daily. 112 g 3  . diphenhydrAMINE (BENADRYL) 25 MG tablet Take 25 mg by mouth every 6 (six) hours as needed.    . diphenhydrAMINE (SOMINEX) 25 MG tablet Take by mouth.    Marland Kitchen  doxycycline (VIBRAMYCIN) 100 MG capsule Take 1 capsule (100 mg total) by mouth 2 (two) times daily. 20 capsule 0  . ferrous sulfate (SLOW IRON) 160 (50 Fe) MG TBCR SR tablet Take 1 tablet (160 mg total) by mouth daily. 30 each 5  . ibuprofen (ADVIL,MOTRIN) 200 MG tablet Take 200 mg by mouth every 6 (six) hours as needed.    . meloxicam (MOBIC) 7.5 MG tablet Take 1 tablet (7.5 mg total) by mouth daily. 30 tablet 0  . ondansetron (ZOFRAN ODT) 4 MG disintegrating tablet Take 1 tablet (4 mg total) by mouth every 8 (eight) hours as needed for nausea or vomiting. 12 tablet 0  . predniSONE (DELTASONE) 20 MG tablet Take 2 tablets by mouth once daily for 5 days 10 tablet 0  . SUMAtriptan (IMITREX) 100 MG tablet Take 1 tablet (100 mg total) by mouth every 2 (two) hours as needed for migraine. May repeat in 2 hours if headache persists or recurs. 10  tablet 3  . Vitamin D, Ergocalciferol, (DRISDOL) 1.25 MG (50000 UT) CAPS capsule Take 1 capsule (50,000 Units total) by mouth every 7 (seven) days. 12 capsule 1   No facility-administered medications prior to visit.    Allergies  Allergen Reactions  . Other Other (See Comments)    ROS Review of Systems  Constitutional: Negative for appetite change, chills, fever and unexpected weight change.  Respiratory: Negative for cough and shortness of breath.   Cardiovascular: Positive for leg swelling. Negative for chest pain and palpitations.      Objective:    Physical Exam Vitals reviewed.  Constitutional:      Appearance: Normal appearance.  Cardiovascular:     Rate and Rhythm: Normal rate and regular rhythm.  Pulmonary:     Effort: Pulmonary effort is normal.     Breath sounds: Normal breath sounds.  Musculoskeletal:     Comments: She does have some obvious swelling of the right lower extremity from the knee all the way down to the ankle compared to the left.  Does have some mild right proximal medial calf tenderness.  Good distal foot pulses.  No significant color changes comparing right to the left.  Neurological:     Mental Status: She is alert.     BP 120/60 (BP Location: Left Arm, Patient Position: Sitting, Cuff Size: Normal)   Pulse 73   Temp 97.9 F (36.6 C) (Oral)   Wt 105 lb 12.8 oz (48 kg)   SpO2 98%   BMI 16.57 kg/m  Wt Readings from Last 3 Encounters:  09/04/20 105 lb 12.8 oz (48 kg)  01/04/20 102 lb (46.3 kg)  11/23/19 102 lb (46.3 kg)     Health Maintenance Due  Topic Date Due  . COVID-19 Vaccine (1) Never done  . TETANUS/TDAP  Never done  . COLONOSCOPY (Pts 45-23yrs Insurance coverage will need to be confirmed)  Never done  . PAP SMEAR-Modifier  12/18/2018    There are no preventive care reminders to display for this patient.  Lab Results  Component Value Date   TSH 1.44 06/22/2015   Lab Results  Component Value Date   WBC 6.3 11/23/2019    HGB 13.6 11/23/2019   HCT 38.7 11/23/2019   MCV 101.6 (H) 11/23/2019   PLT 286 11/23/2019   Lab Results  Component Value Date   NA 139 11/23/2019   K 3.7 11/23/2019   CO2 26 11/23/2019   GLUCOSE 64 (L) 11/23/2019   BUN 11 11/23/2019  CREATININE 0.83 11/23/2019   BILITOT 0.4 11/23/2019   ALKPHOS 68 11/26/2016   AST 18 11/23/2019   ALT 9 11/23/2019   PROT 6.6 11/23/2019   ALBUMIN 4.3 11/26/2016   CALCIUM 8.5 (L) 11/23/2019   GFR 86.14 11/26/2016   Lab Results  Component Value Date   CHOL 107 01/04/2011   Lab Results  Component Value Date   HDL 38 (L) 01/04/2011   Lab Results  Component Value Date   LDLCALC 55 01/04/2011   Lab Results  Component Value Date   TRIG 68 01/04/2011   Lab Results  Component Value Date   CHOLHDL 2.8 01/04/2011   Lab Results  Component Value Date   HGBA1C 5.6 01/03/2011      Assessment & Plan:   Patient presents with approximately 3-day history of right lower extremity edema without any history of recent injury.  Concern is to rule out DVT.  She is not any risk factors such as smoking, hormone use, obesity, recent travel, etc.  -Venous Dopplers been ordered for tomorrow at 11 AM. -We did provide samples of Eliquis to consider taking 10 mg tonight and 10 mg tomorrow morning until further clarified with doppler.  She does not have any known contraindications. -She was given the samples of Eliquis with explicit instructions how to take this  Meds ordered this encounter  Medications  . apixaban (ELIQUIS) 5 MG TABS tablet    Sig: Take 1 tablet (5 mg total) by mouth 2 (two) times daily.    Dispense:  14 tablet    Refill:  0    Lot: [id number] EXP: 02/12/22    Follow-up: No follow-ups on file.    Carolann Littler, MD

## 2020-09-04 NOTE — Patient Instructions (Signed)
Go for Doppler tomorrow as scheduled.  Start Eliquis take two 5 mg tablets (10 mg total) tonight at dinner and take two in morning when you get up.    Follow up immediately for any increased shortness of breath or chest pain.

## 2020-09-05 ENCOUNTER — Telehealth: Payer: Self-pay

## 2020-09-05 ENCOUNTER — Ambulatory Visit (HOSPITAL_COMMUNITY)
Admission: RE | Admit: 2020-09-05 | Discharge: 2020-09-05 | Disposition: A | Discharge status: Home / Self Care | Payer: Managed Care, Other (non HMO) | Source: Ambulatory Visit | Attending: Family Medicine | Admitting: Family Medicine

## 2020-09-05 ENCOUNTER — Other Ambulatory Visit: Payer: Self-pay

## 2020-09-05 DIAGNOSIS — R600 Localized edema: Secondary | ICD-10-CM | POA: Insufficient documentation

## 2020-09-05 NOTE — Telephone Encounter (Signed)
[name] and vascular called to let us know the patients DVT is negative.

## 2020-09-05 NOTE — Telephone Encounter (Signed)
noted 

## 2020-09-11 ENCOUNTER — Ambulatory Visit: Payer: Managed Care, Other (non HMO) | Admitting: Family Medicine

## 2020-09-11 ENCOUNTER — Other Ambulatory Visit: Payer: Self-pay

## 2020-09-12 ENCOUNTER — Encounter: Payer: Self-pay | Admitting: Family Medicine

## 2020-09-12 ENCOUNTER — Ambulatory Visit (INDEPENDENT_AMBULATORY_CARE_PROVIDER_SITE_OTHER): Payer: Managed Care, Other (non HMO) | Admitting: Family Medicine

## 2020-09-12 VITALS — BP 100/60 | HR 78 | Temp 98.3°F | Wt 99.0 lb

## 2020-09-12 DIAGNOSIS — R599 Enlarged lymph nodes, unspecified: Secondary | ICD-10-CM

## 2020-09-12 DIAGNOSIS — D509 Iron deficiency anemia, unspecified: Secondary | ICD-10-CM

## 2020-09-12 DIAGNOSIS — M25561 Pain in right knee: Secondary | ICD-10-CM

## 2020-09-12 DIAGNOSIS — R634 Abnormal weight loss: Secondary | ICD-10-CM | POA: Diagnosis not present

## 2020-09-12 DIAGNOSIS — M7711 Lateral epicondylitis, right elbow: Secondary | ICD-10-CM

## 2020-09-12 NOTE — Patient Instructions (Signed)
Tennis Elbow  Tennis elbow (lateral epicondylitis) is inflammation of tendons in your outer forearm, near your elbow. Tendons are tissues that connect muscle to bone. When you have tennis elbow, inflammation affects the tendons that you use to bend your wrist and move your hand up. Inflammation occurs in the lower part of the upper arm bone (humerus), where the tendons connect to the bone (lateral epicondyle). Tennis elbow often affects people who play tennis, but anyone may get the condition from repeatedly extending the wrist or turning the forearm. What are the causes? This condition is usually caused by repeatedly extending the wrist, turning the forearm, and using the hands. It can result from sports or work that requires repetitive forearm movements. In some cases, it may be caused by a sudden injury. What increases the risk? You are more likely to develop tennis elbow if you play tennis or another racket sport. You also have a higher risk if you frequently use your hands for work. Besides people who play tennis, others at greater risk include:  People who use computers.  Construction workers.  People who work in factories.  Musicians.  Cooks.  Cashiers. What are the signs or symptoms? Symptoms of this condition include:  Pain and tenderness in the forearm and the outer part of the elbow. Pain may be felt only when using the arm, or it may be there all the time.  A burning feeling that starts in the elbow and spreads down the forearm.  A weak grip in the hand. How is this diagnosed? This condition is diagnosed based on your symptoms, your medical history, and a physical exam. You may also have X-rays or an MRI to:  Confirm the diagnosis.  Look for other issues.  Check for tears in the ligaments, muscles, or tendons. How is this treated? Resting and icing your arm is often the first treatment. Your health care provider may also recommend:  Medicines to reduce pain and  inflammation. These may be in the form of a pill, topical gels, or shots of a steroid medicine (cortisone).  An elbow strap to reduce stress on the area.  Physical therapy. This may include massage or exercises or both.  An elbow brace to restrict the movements that cause symptoms. If these treatments do not help relieve your symptoms, your health care provider may recommend surgery to remove damaged muscle and reattach healthy muscle to bone. Follow these instructions at home: If you have a brace or strap:  Wear the brace or strap as told by your health care provider. Remove it only as told by your health care provider.  Check the skin around the brace or strap every day. Tell your health care provider about any concerns.  Loosen the brace if your fingers tingle, become numb, or turn cold and blue.  Keep the brace clean.  If the brace or strap is not waterproof: ? Do not let it get wet. ? Cover it with a watertight covering when you take a bath or a shower. Managing pain, stiffness, and swelling  If directed, put ice on the injured area. To do this: ? If you have a removable brace or strap, remove it as told by your health care provider. ? Put ice in a plastic bag. ? Place a towel between your skin and the bag. ? Leave the ice on for 20 minutes, 2-3 times a day. ? Remove the ice if your skin turns bright red. This is very important. If you cannot   feel pain, heat, or cold, you have a greater risk of damage to the area.  Move your fingers often to reduce stiffness and swelling.   Activity  Rest your elbow and wrist and avoid activities that cause symptoms as told by your health care provider.  Do physical therapy exercises as told by your health care provider.  If you lift an object, lift it with your palm facing up. This reduces stress on your elbow. Lifestyle  If your tennis elbow is caused by sports, check your equipment and make sure that: ? You use it correctly. ? It is  good match for you.  If your tennis elbow is caused by work or computer use, take frequent breaks to stretch your arm. Talk with your employer about ways to manage your condition at work. General instructions  Take over-the-counter and prescription medicines only as told by your health care provider.  Do not use any products that contain nicotine or tobacco. These products include cigarettes, chewing tobacco, and vaping devices, such as e-cigarettes. If you need help quitting, ask your health care provider.  Keep all follow-up visits. This is important. How is this prevented?  Before and after activity: ? Warm up and stretch before being active. ? Cool down and stretch after being active. ? Give your body time to rest between periods of activity.  During activity: ? Make sure to use equipment that fits you. ? If you play tennis, put power in your stroke with your lower body. Avoid using your arm only.  Maintain physical fitness, including: ? Strength. ? Flexibility. ? Endurance.  Do exercises to strengthen the forearm muscles. Contact a health care provider if:  You have pain that gets worse or does not get better with treatment.  You have numbness or weakness in your forearm, hand, or fingers. Get help right away if:  Your pain is severe.  You cannot move your wrist. Summary  Tennis elbow (lateral epicondylitis) is inflammation of tendons in your outer forearm, near your elbow.  Common symptoms include pain and tenderness in your forearm and the outer part of your elbow.  This condition is usually caused by repeatedly extending your wrist, turning your forearm, and using your hands.  The first treatment is often resting and icing your arm to relieve symptoms. Further treatment may include taking medicine, getting physical therapy, wearing a brace or strap, or having surgery. This information is not intended to replace advice given to you by your health care provider.  Make sure you discuss any questions you have with your health care provider. Document Revised: 10/11/2019 Document Reviewed: 10/11/2019 Elsevier Patient Education  2021 Elsevier Inc.  

## 2020-09-12 NOTE — Progress Notes (Signed)
Established Patient Office Visit  Subjective:  Patient ID: [name], female    DOB: 08-07-1970  Age: 50 y.o. MRN: [id number]  CC:  Chief Complaint  Patient presents with  . Follow-up    Edema and swollen lymph node in groin area    HPI [name] presents for several issues as follows.  She had some recent right lower extremity edema and we sent her for Doppler and this ruled out DVT.  She has had some right knee pain but no visible effusion.  Her knee pain is medial.  No locking or giving way.  Her major concern is that she states she has lost about 6 pounds since last week.  She states she is eating regularly and has been very conscious to eat more carbs and more high-protein foods.  She has had previous enlarged lymph node right groin region and actually had biopsy over a year ago which was normal.  She noticed some recent swelling of the right inguinal node.  She also relates some right lateral elbow pain worse with gripping.  This started a week or so ago.  Pain worse with wrist extension.    She does have past history of chronic anemia.  This has been normocytic but she has had iron deficiency.  She had full endoscopic work-up with GI previously.  She has had 1 previous iron infusion which did help to raise her hemoglobin.  Past Medical History:  Diagnosis Date  . 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 FATIGUE 05/04/2009  . Headache(784.0)   . HYPOKALEMIA 10/17/2009  . LOW BLOOD PRESSURE 10/17/2009  . NIGHT SWEATS 05/04/2009  . Polydipsia 05/04/2009  . SHINGLES, HX OF 11/24/2008  . SINUSITIS, ACUTE 03/07/2009  . [place] SHOULDER REGION 11/09/2008    Past Surgical History:  Procedure Laterality Date  . BREAST BIOPSY Left 08/2019  . EXTERNAL AUDITORY CANAL RECONSTRUCTION     tubes as a [place]  . TUBAL LIGATION  2007    Family History  Problem Relation Age of Onset  . Arthritis Other   . Hyperlipidemia Other   . Hypertension Other   . Cancer Other         lung  . Diabetes Other     Social History   Socioeconomic History  . Marital status: Divorced    Spouse name: Not on file  . Number of children: Not on file  . Years of education: Not on file  . Highest education level: Not on file  Occupational History  . Not on file  Tobacco Use  . Smoking status: Never Smoker  . Smokeless tobacco: Never Used  Vaping Use  . Vaping Use: Never used  Substance and Sexual Activity  . Alcohol use: Not on file  . Drug use: Not on file  . Sexual activity: Not on file  Other Topics Concern  . Not on file  Social History Narrative   Works for [employer] but mostly lives by herself   Social Determinants of Radio broadcast assistant Strain: Not on file  Food Insecurity: Not on file  Transportation Needs: Not on file  Physical Activity: Not on file  Stress: Not on file  Social Connections: Not on file  Intimate Partner Violence: Not on file    Outpatient Medications Prior to Visit  Medication Sig Dispense Refill  . apixaban (ELIQUIS) 5 MG TABS tablet Take 1 tablet (5 mg total) by mouth 2 (  two) times daily. 14 tablet 0  . cyclobenzaprine (FLEXERIL) 5 MG tablet Take 1 tablet (5 mg total) by mouth 3 (three) times daily as needed for muscle spasms. 30 tablet 5  . Diclofenac Sodium 2 % SOLN Place 2 g onto the skin 2 (two) times daily. 112 g 3  . diphenhydrAMINE (BENADRYL) 25 MG tablet Take 25 mg by mouth every 6 (six) hours as needed.    . diphenhydrAMINE (SOMINEX) 25 MG tablet Take by mouth.    . doxycycline (VIBRAMYCIN) 100 MG capsule Take 1 capsule (100 mg total) by mouth 2 (two) times daily. 20 capsule 0  . ferrous sulfate (SLOW IRON) 160 (50 Fe) MG TBCR SR tablet Take 1 tablet (160 mg total) by mouth daily. 30 each 5  . ibuprofen (ADVIL,MOTRIN) 200 MG tablet Take 200 mg by mouth every 6 (six) hours as needed.    . meloxicam (MOBIC) 7.5 MG tablet Take 1 tablet (7.5 mg total) by mouth daily. 30 tablet 0  . ondansetron (ZOFRAN  ODT) 4 MG disintegrating tablet Take 1 tablet (4 mg total) by mouth every 8 (eight) hours as needed for nausea or vomiting. 12 tablet 0  . SUMAtriptan (IMITREX) 100 MG tablet Take 1 tablet (100 mg total) by mouth every 2 (two) hours as needed for migraine. May repeat in 2 hours if headache persists or recurs. 10 tablet 3  . Vitamin D, Ergocalciferol, (DRISDOL) 1.25 MG (50000 UT) CAPS capsule Take 1 capsule (50,000 Units total) by mouth every 7 (seven) days. 12 capsule 1   No facility-administered medications prior to visit.    Allergies  Allergen Reactions  . Other Other (See Comments)    ROS Review of Systems  Constitutional: Positive for unexpected weight change. Negative for appetite change, chills and fever.  HENT: Negative for sore throat and trouble swallowing.   Eyes: Negative for visual disturbance.  Respiratory: Negative for cough and shortness of breath.   Cardiovascular: Negative for chest pain.  Gastrointestinal: Negative for abdominal pain and blood in stool.  Endocrine: Negative for polydipsia and polyuria.  Genitourinary: Negative for dysuria and hematuria.  Skin: Negative for rash.  Neurological: Negative for headaches.  Hematological:       See HPI- right inguinal node.  Psychiatric/Behavioral: Negative for dysphoric mood.      Objective:    Physical Exam Vitals reviewed.  Constitutional:      Comments: Thin but otherwise healthy appearing.  HENT:     Right Ear: Tympanic membrane normal.     Left Ear: Tympanic membrane normal.     Mouth/Throat:     Mouth: Mucous membranes are moist.     Pharynx: Oropharynx is clear.  Cardiovascular:     Rate and Rhythm: Normal rate and regular rhythm.  Pulmonary:     Effort: Pulmonary effort is normal.     Breath sounds: Normal breath sounds.  Chest:  Breasts:     Right: No axillary adenopathy or supraclavicular adenopathy.     Left: No axillary adenopathy or supraclavicular adenopathy.    Abdominal:      Palpations: Abdomen is soft. There is no mass.     Tenderness: There is no abdominal tenderness.  Musculoskeletal:     Cervical back: Neck supple.     Comments: RLE edema from last week has resolved.  Right knee no significant effusion.   No warmth.   No erythema.  Mild medial joint tenderness.    Lymphadenopathy:     Head:  Right side of head: No posterior auricular adenopathy.     Left side of head: No posterior auricular adenopathy.     Cervical: No cervical adenopathy.     Right cervical: No superficial, deep or posterior cervical adenopathy.    Left cervical: No superficial, deep or posterior cervical adenopathy.     Upper Body:     Right upper body: No supraclavicular or axillary adenopathy.     Left upper body: No supraclavicular or axillary adenopathy.     Lower Body: Right inguinal adenopathy present.     Comments: Small approx. 1 by 1 cm oval shaped mobile non-tender right inguinal node   Skin:    Findings: No rash.  Neurological:     General: No focal deficit present.     Mental Status: She is alert.     Cranial Nerves: No cranial nerve deficit.     BP 100/60 (BP Location: Left Arm, Patient Position: Sitting, Cuff Size: Normal)   Pulse 78   Temp 98.3 F (36.8 C) (Oral)   Wt 99 lb (44.9 kg)   SpO2 95%   BMI 15.51 kg/m  Wt Readings from Last 3 Encounters:  09/12/20 99 lb (44.9 kg)  09/04/20 105 lb 12.8 oz (48 kg)  01/04/20 102 lb (46.3 kg)     Health Maintenance Due  Topic Date Due  . COVID-19 Vaccine (1) Never done  . TETANUS/TDAP  Never done  . COLONOSCOPY (Pts 45-53yrs Insurance coverage will need to be confirmed)  Never done  . PAP SMEAR-Modifier  12/18/2018    There are no preventive care reminders to display for this patient.  Lab Results  Component Value Date   TSH 1.44 06/22/2015   Lab Results  Component Value Date   WBC 6.3 11/23/2019   HGB 13.6 11/23/2019   HCT 38.7 11/23/2019   MCV 101.6 (H) 11/23/2019   PLT 286 11/23/2019   Lab  Results  Component Value Date   NA 139 11/23/2019   K 3.7 11/23/2019   CO2 26 11/23/2019   GLUCOSE 64 (L) 11/23/2019   BUN 11 11/23/2019   CREATININE 0.83 11/23/2019   BILITOT 0.4 11/23/2019   ALKPHOS 68 11/26/2016   AST 18 11/23/2019   ALT 9 11/23/2019   PROT 6.6 11/23/2019   ALBUMIN 4.3 11/26/2016   CALCIUM 8.5 (L) 11/23/2019   GFR 86.14 11/26/2016   Lab Results  Component Value Date   CHOL 107 01/04/2011   Lab Results  Component Value Date   HDL 38 (L) 01/04/2011   Lab Results  Component Value Date   LDLCALC 55 01/04/2011   Lab Results  Component Value Date   TRIG 68 01/04/2011   Lab Results  Component Value Date   CHOLHDL 2.8 01/04/2011   Lab Results  Component Value Date   HGBA1C 5.6 01/03/2011      Assessment & Plan:   #1 right lower extremity edema recently ruled out for DVT.  In retrospect, question whether some of this may have been due to right knee issue though she does not have any evidence for significant effusion at this time.  #2 right lateral epicondylitis -Recommend icing and consider trial of over-the-counter topical diclofenac -Consider steroid injection if not improving with the above.  #3 small right inguinal node.  No worrisome features.  Recommend close observation and be in touch in 2 weeks if this is not going down in size  #4 weight loss of approximately 6 pounds over the past week which  is unintentional.  Sounds like her appetite is good and calorie intake has been good which is surprising.  This brings up the issue of whether some of this is fluid shifting although she did not have any generalized edema recently -Check further labs with TSH, sed rate, CRP, chemistries -Suggested free app for calorie counting  #5 history of chronic anemia.  She has had some iron deficiency previously and negative GI work-up.  No history of celiac disease.  Question history of poor absorption.  Did respond to iron infusion previously  #6 right knee  pain- ? Medial meniscus injury.   No orders of the defined types were placed in this encounter.   Follow-up: No follow-ups on file.    Carolann Littler, MD

## 2020-09-13 ENCOUNTER — Telehealth: Payer: Self-pay | Admitting: Family Medicine

## 2020-09-13 LAB — CBC WITH DIFFERENTIAL/PLATELET
Basophils Absolute: 0.1 10*3/uL (ref 0.0–0.1)
Basophils Relative: 0.7 % (ref 0.0–3.0)
Eosinophils Absolute: 0.1 10*3/uL (ref 0.0–0.7)
Eosinophils Relative: 0.8 % (ref 0.0–5.0)
HCT: 33.4 % — ABNORMAL LOW (ref 36.0–46.0)
Hemoglobin: 11 g/dL — ABNORMAL LOW (ref 12.0–15.0)
Lymphocytes Relative: 29.6 % (ref 12.0–46.0)
Lymphs Abs: 2.2 10*3/uL (ref 0.7–4.0)
MCHC: 33 g/dL (ref 30.0–36.0)
MCV: 93.1 fl (ref 78.0–100.0)
Monocytes Absolute: 0.6 10*3/uL (ref 0.1–1.0)
Monocytes Relative: 8 % (ref 3.0–12.0)
Neutro Abs: 4.6 10*3/uL (ref 1.4–7.7)
Neutrophils Relative %: 60.9 % (ref 43.0–77.0)
Platelets: 303 10*3/uL (ref 150.0–400.0)
RBC: 3.58 Mil/uL — ABNORMAL LOW (ref 3.87–5.11)
RDW: 15.8 % — ABNORMAL HIGH (ref 11.5–15.5)
WBC: 7.5 10*3/uL (ref 4.0–10.5)

## 2020-09-13 LAB — FERRITIN: Ferritin: 3.8 ng/mL — ABNORMAL LOW (ref 10.0–291.0)

## 2020-09-13 LAB — SEDIMENTATION RATE: Sed Rate: 21 mm/hr — ABNORMAL HIGH (ref 0–20)

## 2020-09-13 LAB — TSH: TSH: 0.99 u[IU]/mL (ref 0.35–4.50)

## 2020-09-13 NOTE — Telephone Encounter (Signed)
Patient is calling and wanted to see if her lab results, were back, please advise.CB is [phone]

## 2020-09-14 LAB — HEPATIC FUNCTION PANEL
ALT: 13 U/L (ref 0–35)
AST: 19 U/L (ref 0–37)
Albumin: 4 g/dL (ref 3.5–5.2)
Alkaline Phosphatase: 76 U/L (ref 39–117)
Bilirubin, Direct: 0.1 mg/dL (ref 0.0–0.3)
Total Bilirubin: 0.4 mg/dL (ref 0.2–1.2)
Total Protein: 6.6 g/dL (ref 6.0–8.3)

## 2020-09-14 LAB — BASIC METABOLIC PANEL
BUN: 18 mg/dL (ref 6–23)
CO2: 21 mEq/L (ref 19–32)
Calcium: 8.5 mg/dL (ref 8.4–10.5)
Chloride: 107 mEq/L (ref 96–112)
Creatinine, Ser: 0.78 mg/dL (ref 0.40–1.20)
GFR: 89.55 mL/min (ref >60.00)
Glucose, Bld: 85 mg/dL (ref 70–99)
Potassium: 3.8 mEq/L (ref 3.5–5.1)
Sodium: 142 mEq/L (ref 135–145)

## 2020-09-14 LAB — C-REACTIVE PROTEIN: CRP: <1 mg/dL (ref 0.5–20.0)

## 2020-09-14 NOTE — Telephone Encounter (Signed)
Please advise. From what I can see these have been collected but not resulted yet.

## 2020-09-14 NOTE — Telephone Encounter (Signed)
Left a detailed message on verified voice mail informing the patient that these results are still pending and that I will reach out to her once we get these results in. Nothing further needed at this time.

## 2020-09-17 ENCOUNTER — Telehealth: Payer: Self-pay | Admitting: Family Medicine

## 2020-09-17 NOTE — Telephone Encounter (Signed)
Pt returned the call to the office for her lab results

## 2020-09-17 NOTE — Telephone Encounter (Signed)
See result notes for further documentation.  

## 2020-11-05 ENCOUNTER — Other Ambulatory Visit: Payer: Self-pay

## 2020-11-05 ENCOUNTER — Ambulatory Visit (INDEPENDENT_AMBULATORY_CARE_PROVIDER_SITE_OTHER): Payer: Managed Care, Other (non HMO) | Admitting: Family Medicine

## 2020-11-05 VITALS — BP 98/58 | HR 75 | Temp 98.6°F | Ht 67.0 in | Wt 106.3 lb

## 2020-11-05 DIAGNOSIS — R600 Localized edema: Secondary | ICD-10-CM | POA: Diagnosis not present

## 2020-11-05 DIAGNOSIS — R599 Enlarged lymph nodes, unspecified: Secondary | ICD-10-CM

## 2020-11-05 MED ORDER — ONDANSETRON 4 MG PO TBDP: 4.0000 mg | ORAL_TABLET | Freq: Three times a day (TID) | ORAL | 0 refills | Status: DC | PRN

## 2020-11-05 MED ORDER — CYCLOBENZAPRINE HCL 5 MG PO TABS: 5.0000 mg | ORAL_TABLET | Freq: Three times a day (TID) | ORAL | 5 refills | Status: DC | PRN

## 2020-11-05 NOTE — Progress Notes (Signed)
Established Patient Office Visit  Subjective:  Patient ID: [name], female    DOB: 19-Apr-1970  Age: 50 y.o. MRN: [id number]  CC:  Chief Complaint  Patient presents with   Follow-up   Leg Swelling    Right leg    HPI  [name] presents for concern for recurrent swelling right lower extremity.  She first noticed this back in May and had venous Dopplers which showed no evidence for clot.  There is no history of injury.  Her swelling has been somewhat intermittent.  She does have past history of enlarged lymph node right inguinal region several years ago and had biopsy per general surgery which was benign.  She has noticed some occasional intermittent adenopathy since then.  Denies any numbness or weakness lower extremity.  No prior history of DVT.  No dyspnea or chest pain.  Her swelling seem to be some improved but then just yesterday noted some recurrent swelling of the right lower extremity.  She took pictures and there was some obvious difference compared to the left side.  No leg pain.  No evidence for arterial involvement.  Denies any low back pain.  Appetite and weight are stable.  No recent fever.  No cellulitis changes.  She took some ibuprofen and swelling seems to be better now even compared to this morning.  Past Medical History:  Diagnosis Date   ECZEMA 11/24/2008   FATIGUE 05/04/2009   Headache(784.0)    HYPOKALEMIA 10/17/2009   LOW BLOOD PRESSURE 10/17/2009   NIGHT SWEATS 05/04/2009   Polydipsia 05/04/2009   SHINGLES, HX OF 11/24/2008   SINUSITIS, ACUTE 03/07/2009   UNSPEC DISORDERS BURSAE&TENDONS SHOULDER REGION 11/09/2008    Past Surgical History:  Procedure Laterality Date   BREAST BIOPSY Left 08/2019   EXTERNAL AUDITORY CANAL RECONSTRUCTION     tubes as a [place]   TUBAL LIGATION  2007    Family History  Problem Relation Age of Onset   Arthritis Other    Hyperlipidemia Other    Hypertension Other    Cancer Other        lung   Diabetes Other      Social History   Socioeconomic History   Marital status: Divorced    Spouse name: Not on file   Number of children: Not on file   Years of education: Not on file   Highest education level: Not on file  Occupational History   Not on file  Tobacco Use   Smoking status: Never   Smokeless tobacco: Never  Vaping Use   Vaping Use: Never used  Substance and Sexual Activity   Alcohol use: Not on file   Drug use: Not on file   Sexual activity: Not on file  Other Topics Concern   Not on file  Social History Narrative   Works for [employer]       household of 2 but mostly lives by herself   Social Determinants of Radio broadcast assistant Strain: Not on file  Food Insecurity: Not on file  Transportation Needs: Not on file  Physical Activity: Not on file  Stress: Not on file  Social Connections: Not on file  Intimate Partner Violence: Not on file    Outpatient Medications Prior to Visit  Medication Sig Dispense Refill   apixaban (ELIQUIS) 5 MG TABS tablet Take 1 tablet (5 mg total) by mouth 2 (two) times daily. 14 tablet 0   Diclofenac Sodium 2 % SOLN Place 2 g  onto the skin 2 (two) times daily. 112 g 3   diphenhydrAMINE (BENADRYL) 25 MG tablet Take 25 mg by mouth every 6 (six) hours as needed.     diphenhydrAMINE (SOMINEX) 25 MG tablet Take by mouth.     ferrous sulfate (SLOW IRON) 160 (50 Fe) MG TBCR SR tablet Take 1 tablet (160 mg total) by mouth daily. 30 each 5   ibuprofen (ADVIL,MOTRIN) 200 MG tablet Take 200 mg by mouth every 6 (six) hours as needed.     meloxicam (MOBIC) 7.5 MG tablet Take 1 tablet (7.5 mg total) by mouth daily. 30 tablet 0   SUMAtriptan (IMITREX) 100 MG tablet Take 1 tablet (100 mg total) by mouth every 2 (two) hours as needed for migraine. May repeat in 2 hours if headache persists or recurs. 10 tablet 3   Vitamin D, Ergocalciferol, (DRISDOL) 1.25 MG (50000 UT) CAPS capsule Take 1 capsule (50,000 Units total) by mouth every 7 (seven) days. 12 capsule 1    cyclobenzaprine (FLEXERIL) 5 MG tablet Take 1 tablet (5 mg total) by mouth 3 (three) times daily as needed for muscle spasms. 30 tablet 5   ondansetron (ZOFRAN ODT) 4 MG disintegrating tablet Take 1 tablet (4 mg total) by mouth every 8 (eight) hours as needed for nausea or vomiting. 12 tablet 0   doxycycline (VIBRAMYCIN) 100 MG capsule Take 1 capsule (100 mg total) by mouth 2 (two) times daily. (Patient not taking: Reported on 11/05/2020) 20 capsule 0   No facility-administered medications prior to visit.    Allergies  Allergen Reactions   Other Other (See Comments)    ROS Review of Systems  Constitutional:  Negative for chills and fever.  Respiratory:  Negative for cough and shortness of breath.   Cardiovascular:  Positive for leg swelling. Negative for chest pain and palpitations.  Gastrointestinal:  Negative for abdominal pain.  Hematological:  Does not bruise/bleed easily.     Objective:    Physical Exam Vitals reviewed.  Constitutional:      Appearance: Normal appearance.  Cardiovascular:     Rate and Rhythm: Normal rate and regular rhythm.  Pulmonary:     Effort: Pulmonary effort is normal.     Breath sounds: Normal breath sounds.  Musculoskeletal:     Comments: She has some small inguinal nodes right and left side.  No large nodes palpated.  No calf pain.  No lower extremity erythema.  Feet are both warm to touch with good capillary refill and good distal pulses.  She does have some very subtle mild edema right lower extremity compared to the left.  No pitting edema.  Neurological:     Mental Status: She is alert.     Comments: Full strength lower extremities throughout    BP (!) 98/58   Pulse 75   Temp 98.6 F (37 C) (Oral)   Ht '5\' 7"'$  (1.702 m)   Wt 106 lb 4.8 oz (48.2 kg)   SpO2 99%   BMI 16.65 kg/m  Wt Readings from Last 3 Encounters:  11/05/20 106 lb 4.8 oz (48.2 kg)  09/12/20 99 lb (44.9 kg)  09/04/20 105 lb 12.8 oz (48 kg)     Health Maintenance  Due  Topic Date Due   COVID-19 Vaccine (1) Never done   Pneumococcal Vaccine 38-68 Years old (1 - PCV) Never done   TETANUS/TDAP  Never done   COLONOSCOPY (Pts 45-42yr Insurance coverage will need to be confirmed)  Never done   PAP SMEAR-Modifier  12/18/2018    There are no preventive care reminders to display for this patient.  Lab Results  Component Value Date   TSH 0.99 09/12/2020   Lab Results  Component Value Date   WBC 7.5 09/12/2020   HGB 11.0 (L) 09/12/2020   HCT 33.4 (L) 09/12/2020   MCV 93.1 09/12/2020   PLT 303.0 09/12/2020   Lab Results  Component Value Date   NA 142 09/12/2020   K 3.8 09/12/2020   CO2 21 09/12/2020   GLUCOSE 85 09/12/2020   BUN 18 09/12/2020   CREATININE 0.78 09/12/2020   BILITOT 0.4 09/12/2020   ALKPHOS 76 09/12/2020   AST 19 09/12/2020   ALT 13 09/12/2020   PROT 6.6 09/12/2020   ALBUMIN 4.0 09/12/2020   CALCIUM 8.5 09/12/2020   GFR 89.55 09/12/2020   Lab Results  Component Value Date   CHOL 107 01/04/2011   Lab Results  Component Value Date   HDL 38 (L) 01/04/2011   Lab Results  Component Value Date   LDLCALC 55 01/04/2011   Lab Results  Component Value Date   TRIG 68 01/04/2011   Lab Results  Component Value Date   CHOLHDL 2.8 01/04/2011   Lab Results  Component Value Date   HGBA1C 5.6 01/03/2011      Assessment & Plan:   Patient relates couple month history of intermittent swelling right lower extremity.  Previous venous Doppler was negative for DVT.  No evidence for cellulitis.  No evidence to suggest compartment syndrome.  -Discussed issues regarding D-dimer testing including false positives.  We will go ahead with D-dimer -Would consider possible CT pelvis to evaluate for any kind of compressive mass in the pelvis given the intermittent nature of her edema.   -Patient had requested refills of Flexeril for previous back issue and this was done today   Meds ordered this encounter  Medications   ondansetron  (ZOFRAN ODT) 4 MG disintegrating tablet    Sig: Take 1 tablet (4 mg total) by mouth every 8 (eight) hours as needed for nausea or vomiting.    Dispense:  12 tablet    Refill:  0   cyclobenzaprine (FLEXERIL) 5 MG tablet    Sig: Take 1 tablet (5 mg total) by mouth 3 (three) times daily as needed for muscle spasms.    Dispense:  30 tablet    Refill:  5    Follow-up: No follow-ups on file.    Carolann Littler, MD

## 2020-11-06 ENCOUNTER — Telehealth: Payer: Self-pay | Admitting: Family Medicine

## 2020-11-06 LAB — D-DIMER, QUANTITATIVE: D-Dimer, Quant: 0.22 mcg/mL FEU (ref <0.50)

## 2020-11-06 NOTE — Telephone Encounter (Signed)
Patient called for results °

## 2020-11-06 NOTE — Telephone Encounter (Signed)
Spoke with pt about results

## 2020-11-06 NOTE — Telephone Encounter (Signed)
Patient is calling to get the results of her lab work

## 2021-01-17 ENCOUNTER — Other Ambulatory Visit: Payer: Self-pay | Admitting: Family Medicine

## 2021-04-14 DIAGNOSIS — R0600 Dyspnea, unspecified: Secondary | ICD-10-CM

## 2021-04-14 HISTORY — DX: Dyspnea, unspecified: R06.00

## 2021-05-22 ENCOUNTER — Ambulatory Visit (INDEPENDENT_AMBULATORY_CARE_PROVIDER_SITE_OTHER): Payer: Managed Care, Other (non HMO) | Admitting: Family Medicine

## 2021-05-22 VITALS — BP 110/62 | HR 75 | Temp 97.6°F | Wt 104.1 lb

## 2021-05-22 DIAGNOSIS — E559 Vitamin D deficiency, unspecified: Secondary | ICD-10-CM

## 2021-05-22 DIAGNOSIS — D509 Iron deficiency anemia, unspecified: Secondary | ICD-10-CM | POA: Diagnosis not present

## 2021-05-22 DIAGNOSIS — R600 Localized edema: Secondary | ICD-10-CM

## 2021-05-22 LAB — CBC WITH DIFFERENTIAL/PLATELET
Basophils Absolute: 0 10*3/uL (ref 0.0–0.1)
Basophils Relative: 0.8 % (ref 0.0–3.0)
Eosinophils Absolute: 0.1 10*3/uL (ref 0.0–0.7)
Eosinophils Relative: 1.4 % (ref 0.0–5.0)
HCT: 33.9 % — ABNORMAL LOW (ref 36.0–46.0)
Hemoglobin: 11 g/dL — ABNORMAL LOW (ref 12.0–15.0)
Lymphocytes Relative: 29 % (ref 12.0–46.0)
Lymphs Abs: 1.8 10*3/uL (ref 0.7–4.0)
MCHC: 32.3 g/dL (ref 30.0–36.0)
MCV: 91.3 fl (ref 78.0–100.0)
Monocytes Absolute: 0.5 10*3/uL (ref 0.1–1.0)
Monocytes Relative: 7.7 % (ref 3.0–12.0)
Neutro Abs: 3.7 10*3/uL (ref 1.4–7.7)
Neutrophils Relative %: 61.1 % (ref 43.0–77.0)
Platelets: 260 10*3/uL (ref 150.0–400.0)
RBC: 3.71 Mil/uL — ABNORMAL LOW (ref 3.87–5.11)
RDW: 15.9 % — ABNORMAL HIGH (ref 11.5–15.5)
WBC: 6.1 10*3/uL (ref 4.0–10.5)

## 2021-05-22 LAB — BASIC METABOLIC PANEL
BUN: 14 mg/dL (ref 6–23)
CO2: 27 mEq/L (ref 19–32)
Calcium: 8.4 mg/dL (ref 8.4–10.5)
Chloride: 105 mEq/L (ref 96–112)
Creatinine, Ser: 0.73 mg/dL (ref 0.40–1.20)
GFR: 96.5 mL/min (ref >60.00)
Glucose, Bld: 86 mg/dL (ref 70–99)
Potassium: 4.1 mEq/L (ref 3.5–5.1)
Sodium: 140 mEq/L (ref 135–145)

## 2021-05-22 LAB — FERRITIN: Ferritin: 4.3 ng/mL — ABNORMAL LOW (ref 10.0–291.0)

## 2021-05-22 LAB — VITAMIN D 25 HYDROXY (VIT D DEFICIENCY, FRACTURES): VITD: 14.99 ng/mL — ABNORMAL LOW (ref 30.00–100.00)

## 2021-05-22 MED ORDER — VITAMIN D (ERGOCALCIFEROL) 1.25 MG (50000 UNIT) PO CAPS: 50000.0000 [IU] | ORAL_CAPSULE | ORAL | 1 refills | Status: DC

## 2021-05-22 MED ORDER — SLOW IRON 160 (50 FE) MG PO TBCR: 160.0000 mg | EXTENDED_RELEASE_TABLET | Freq: Every day | ORAL | 1 refills | Status: AC

## 2021-05-22 MED ORDER — CYCLOBENZAPRINE HCL 5 MG PO TABS: 5.0000 mg | ORAL_TABLET | Freq: Three times a day (TID) | ORAL | 1 refills | Status: DC | PRN

## 2021-05-22 NOTE — Progress Notes (Signed)
Established Patient Office Visit  Subjective:  Patient ID: [name], female    DOB: 10/26/1970  Age: 51 y.o. MRN: [id number]  CC:  Chief Complaint  Patient presents with   Follow-up    Leg still swelling    HPI Datto presents for intermittent swelling right lower extremity.  She first noticed this last spring.  She had venous Dopplers which showed no evidence for DVT.  Her swelling initially was more intermittent and becoming more consistent recently.  We had recommended consideration for CT pelvis to further assess but her insurance refused.  Denies any history of lower extremity injury.  She has sensation of fullness sometimes with walking but no significant pain.  No history of any surgery right lower extremity.  History of vitamin D deficiency.  She is requesting repeat vitamin D levels.  She has history of iron deficiency anemia.  Has had extensive work-up with GI.  Did have iron transfusion once previously which helped.  She is requesting repeat levels.  She does have some fatigue issues.  Also request refills of her iron tablet.  She has had some chronic musculoskeletal tension in her back and neck and has taken Flexeril and requesting refills.  Wt Readings from Last 3 Encounters:  05/22/21 104 lb 1.6 oz (47.2 kg)  11/05/20 106 lb 4.8 oz (48.2 kg)  09/12/20 99 lb (44.9 kg)      Past Medical History:  Diagnosis Date   ECZEMA 11/24/2008   FATIGUE 05/04/2009   Headache(784.0)    HYPOKALEMIA 10/17/2009   LOW BLOOD PRESSURE 10/17/2009   NIGHT SWEATS 05/04/2009   Polydipsia 05/04/2009   SHINGLES, HX OF 11/24/2008   SINUSITIS, ACUTE 03/07/2009   UNSPEC DISORDERS BURSAE&TENDONS SHOULDER REGION 11/09/2008    Past Surgical History:  Procedure Laterality Date   BREAST BIOPSY Left 08/2019   EXTERNAL AUDITORY CANAL RECONSTRUCTION     tubes as a [place]   TUBAL LIGATION  2007    Family History  Problem Relation Age of Onset   Arthritis Other    Hyperlipidemia Other     Hypertension Other    Cancer Other        lung   Diabetes Other     Social History   Socioeconomic History   Marital status: Divorced    Spouse name: Not on file   Number of children: Not on file   Years of education: Not on file   Highest education level: Not on file  Occupational History   Not on file  Tobacco Use   Smoking status: Never   Smokeless tobacco: Never  Vaping Use   Vaping Use: Never used  Substance and Sexual Activity   Alcohol use: Not on file   Drug use: Not on file   Sexual activity: Not on file  Other Topics Concern   Not on file  Social History Narrative   Works for [employer]       household of 2 but mostly lives by herself   Social Determinants of Radio broadcast assistant Strain: Not on file  Food Insecurity: Not on file  Transportation Needs: Not on file  Physical Activity: Not on file  Stress: Not on file  Social Connections: Not on file  Intimate Partner Violence: Not on file    Outpatient Medications Prior to Visit  Medication Sig Dispense Refill   Diclofenac Sodium 2 % SOLN Place 2 g onto the skin 2 (two) times daily. 112 g 3  diphenhydrAMINE (BENADRYL) 25 MG tablet Take 25 mg by mouth every 6 (six) hours as needed.     diphenhydrAMINE (SOMINEX) 25 MG tablet Take by mouth.     ibuprofen (ADVIL,MOTRIN) 200 MG tablet Take 200 mg by mouth every 6 (six) hours as needed.     meloxicam (MOBIC) 7.5 MG tablet Take 1 tablet (7.5 mg total) by mouth daily. 30 tablet 0   ondansetron (ZOFRAN ODT) 4 MG disintegrating tablet Take 1 tablet (4 mg total) by mouth every 8 (eight) hours as needed for nausea or vomiting. 12 tablet 0   SUMAtriptan (IMITREX) 100 MG tablet Take 1 tablet (100 mg total) by mouth every 2 (two) hours as needed for migraine. May repeat in 2 hours if headache persists or recurs. 10 tablet 3   cyclobenzaprine (FLEXERIL) 5 MG tablet Take 1 tablet (5 mg total) by mouth 3 (three) times daily as needed for muscle spasms. 30 tablet 5    ferrous sulfate (SLOW IRON) 160 (50 Fe) MG TBCR SR tablet Take 1 tablet (160 mg total) by mouth daily. 30 each 5   Vitamin D, Ergocalciferol, (DRISDOL) 1.25 MG (50000 UT) CAPS capsule Take 1 capsule (50,000 Units total) by mouth every 7 (seven) days. 12 capsule 1   No facility-administered medications prior to visit.    Allergies  Allergen Reactions   Other Other (See Comments)    ROS Review of Systems  Constitutional:  Positive for fatigue. Negative for chills and fever.  Respiratory:  Negative for shortness of breath.   Cardiovascular:  Positive for leg swelling. Negative for chest pain.  Gastrointestinal:  Negative for abdominal pain.  Hematological:  Negative for adenopathy.     Objective:    Physical Exam Vitals reviewed.  Cardiovascular:     Rate and Rhythm: Normal rate and regular rhythm.  Pulmonary:     Effort: Pulmonary effort is normal.     Breath sounds: Normal breath sounds.  Abdominal:     Palpations: Abdomen is soft. There is no mass.     Tenderness: There is no abdominal tenderness.  Musculoskeletal:     Comments: Definitely has some swelling of the right lower extremity compared to the left.  No pitting edema.  Minimal calf tenderness.  No erythema.  No ecchymosis.  Skin:    Findings: No rash.  Neurological:     Mental Status: She is alert.    BP 110/62 (BP Location: Left Arm, Patient Position: Sitting, Cuff Size: Normal)    Pulse 75    Temp 97.6 F (36.4 C) (Oral)    Wt 104 lb 1.6 oz (47.2 kg)    SpO2 99%    BMI 16.30 kg/m  Wt Readings from Last 3 Encounters:  05/22/21 104 lb 1.6 oz (47.2 kg)  11/05/20 106 lb 4.8 oz (48.2 kg)  09/12/20 99 lb (44.9 kg)     Health Maintenance Due  Topic Date Due   COVID-19 Vaccine (1) Never done   TETANUS/TDAP  Never done   COLONOSCOPY (Pts 45-53yrs Insurance coverage will need to be confirmed)  Never done   PAP SMEAR-Modifier  12/18/2018    There are no preventive care reminders to display for this  patient.  Lab Results  Component Value Date   TSH 0.99 09/12/2020   Lab Results  Component Value Date   WBC 7.5 09/12/2020   HGB 11.0 (L) 09/12/2020   HCT 33.4 (L) 09/12/2020   MCV 93.1 09/12/2020   PLT 303.0 09/12/2020   Lab Results  Component Value Date   NA 142 09/12/2020   K 3.8 09/12/2020   CO2 21 09/12/2020   GLUCOSE 85 09/12/2020   BUN 18 09/12/2020   CREATININE 0.78 09/12/2020   BILITOT 0.4 09/12/2020   ALKPHOS 76 09/12/2020   AST 19 09/12/2020   ALT 13 09/12/2020   PROT 6.6 09/12/2020   ALBUMIN 4.0 09/12/2020   CALCIUM 8.5 09/12/2020   GFR 89.55 09/12/2020   Lab Results  Component Value Date   CHOL 107 01/04/2011   Lab Results  Component Value Date   HDL 38 (L) 01/04/2011   Lab Results  Component Value Date   LDLCALC 55 01/04/2011   Lab Results  Component Value Date   TRIG 68 01/04/2011   Lab Results  Component Value Date   CHOLHDL 2.8 01/04/2011   Lab Results  Component Value Date   HGBA1C 5.6 01/03/2011      Assessment & Plan:   Problem List Items Addressed This Visit       Unprioritized   Iron deficiency anemia   Relevant Medications   ferrous sulfate (SLOW IRON) 160 (50 Fe) MG TBCR SR tablet   Other Relevant Orders   CBC with Differential/Platelet   Ferritin   Vitamin D deficiency - Primary   Relevant Medications   Vitamin D, Ergocalciferol, (DRISDOL) 1.25 MG (50000 UNIT) CAPS capsule   Other Relevant Orders   VITAMIN D 25 Hydroxy (Vit-D Deficiency, Fractures)   Other Visit Diagnoses     Leg edema, right       Relevant Orders   D-dimer, Quantitative   Basic metabolic panel     -refill Flexeril and Vit D -check repeat labs with Vit D, CBC, ferritin -set up pelvic ultrasound to further assess her chronic RLE edema.  Rule out pelvic mass causing venous compression. -may need repeat iron infusion if hgb/ferritin dropping.   She is already on oral iron.  Meds ordered this encounter  Medications   cyclobenzaprine  (FLEXERIL) 5 MG tablet    Sig: Take 1 tablet (5 mg total) by mouth 3 (three) times daily as needed for muscle spasms.    Dispense:  30 tablet    Refill:  1   ferrous sulfate (SLOW IRON) 160 (50 Fe) MG TBCR SR tablet    Sig: Take 1 tablet (160 mg total) by mouth daily.    Dispense:  90 tablet    Refill:  1   Vitamin D, Ergocalciferol, (DRISDOL) 1.25 MG (50000 UNIT) CAPS capsule    Sig: Take 1 capsule (50,000 Units total) by mouth every 7 (seven) days.    Dispense:  12 capsule    Refill:  1    Follow-up: No follow-ups on file.    Carolann Littler, MD

## 2021-05-22 NOTE — Patient Instructions (Signed)
I will set up pelvic ultrasound to further assess.   We will call with lab results.

## 2021-05-23 LAB — D-DIMER, QUANTITATIVE: D-Dimer, Quant: <0.19 mcg/mL FEU (ref <0.50)

## 2021-05-24 ENCOUNTER — Other Ambulatory Visit: Payer: Self-pay | Admitting: Family Medicine

## 2021-05-24 ENCOUNTER — Telehealth: Payer: Self-pay | Admitting: Family Medicine

## 2021-05-24 DIAGNOSIS — Z1231 Encounter for screening mammogram for malignant neoplasm of breast: Secondary | ICD-10-CM

## 2021-05-24 NOTE — Telephone Encounter (Signed)
See result notes. 

## 2021-05-24 NOTE — Telephone Encounter (Signed)
Pt call and stated she is returning your call and will wait for a call back. °

## 2021-05-28 ENCOUNTER — Other Ambulatory Visit: Payer: Self-pay

## 2021-05-28 ENCOUNTER — Ambulatory Visit
Admission: RE | Admit: 2021-05-28 | Discharge: 2021-05-28 | Disposition: A | Discharge status: Home / Self Care | Payer: Managed Care, Other (non HMO) | Source: Ambulatory Visit | Attending: Family Medicine | Admitting: Family Medicine

## 2021-05-28 DIAGNOSIS — Z1231 Encounter for screening mammogram for malignant neoplasm of breast: Secondary | ICD-10-CM

## 2021-05-30 ENCOUNTER — Ambulatory Visit
Admission: RE | Admit: 2021-05-30 | Discharge: 2021-05-30 | Disposition: A | Discharge status: Home / Self Care | Payer: Managed Care, Other (non HMO) | Source: Ambulatory Visit | Attending: Family Medicine | Admitting: Family Medicine

## 2021-05-30 DIAGNOSIS — R600 Localized edema: Secondary | ICD-10-CM

## 2021-07-08 ENCOUNTER — Other Ambulatory Visit: Payer: Self-pay | Admitting: Obstetrics and Gynecology

## 2021-07-08 DIAGNOSIS — N879 Dysplasia of cervix uteri, unspecified: Secondary | ICD-10-CM

## 2021-07-08 DIAGNOSIS — N871 Moderate cervical dysplasia: Secondary | ICD-10-CM

## 2021-07-25 ENCOUNTER — Other Ambulatory Visit: Payer: Self-pay

## 2021-07-25 ENCOUNTER — Encounter (HOSPITAL_BASED_OUTPATIENT_CLINIC_OR_DEPARTMENT_OTHER): Payer: Self-pay | Admitting: Obstetrics and Gynecology

## 2021-07-25 NOTE — Progress Notes (Signed)
I called and left Jarrett Soho, surgery coordinator with Dr. Newton Pigg, and let her know that per Dr. Clarita Leber, [facility] the patient needs medical clearance from PCP prior to surgery due to dyspnea. ?

## 2021-07-25 NOTE — Progress Notes (Signed)
Spoke w/ via phone for pre-op interview---Maevyn ?Lab needs dos---- urine pregnancy POCT              ?Lab results------07/30/21 lab appt for CBC w/ diff, CMP, type & screen ?COVID test -----patient states asymptomatic no test needed ?Arrive at -------0700 on Thursday, 08/01/21 ?NPO after MN NO Solid Food.  Clear liquids from MN until---0600 ?Med rec completed ?Medications to take morning of surgery -----Flexeril prn ?Diabetic medication -----n/a ?Patient instructed no nail polish to be worn day of surgery ?Patient instructed to bring photo id and insurance card day of surgery ?Patient aware to have Driver (ride ) / caregiver    for 24 hours after surgery - boyfriend or mother ?Patient Special Instructions -----none ?Pre-Op special Istructions -----none ?Patient verbalized understanding of instructions that were given at this phone interview. ?Patient denies chest pain, fever, cough at this phone interview.  ?Patient stated that she had COVID-19 in February 2021. She was very sick and hospitalized in the ICU for 3 days. She states that since this time she gets SOB with any exertion and sometimes wakes up gasping for air at night. She states the dyspnea is slowly improving over time. ?Patient states that in December 2021, while she was in [place], [place] she had a sudden onset of syncope and respiratory arrest. She states that she was hospitalized for 3 days and was on the vent for less than 1 day. She has no history of home oxygen use. ?She does not use inhalers or breathing treatments. She follows with PCP , Dr. Carolann Littler. LOV 05/22/2021 in Epic. ?Reviewed case with Dr. Clarita Leber, [facility]. Per Dr. Kerin Perna, patient needs medical clearance from PCP prior to surgery. ?

## 2021-07-29 ENCOUNTER — Telehealth: Payer: Self-pay | Admitting: Family Medicine

## 2021-07-29 NOTE — Telephone Encounter (Signed)
Pt needs medical clearance form filled out. 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 Having surgery at [facility]. Pt states time is of the essence and this form needs to be filled out and returned by Wednesday.advised patient we would reach out if anything else was needed.   ?

## 2021-07-29 NOTE — Telephone Encounter (Signed)
Please advise 

## 2021-07-30 ENCOUNTER — Encounter (HOSPITAL_COMMUNITY)
Admission: RE | Admit: 2021-07-30 | Discharge: 2021-07-30 | Disposition: A | Discharge status: Home / Self Care | Payer: Managed Care, Other (non HMO) | Source: Ambulatory Visit | Attending: Obstetrics and Gynecology | Admitting: Obstetrics and Gynecology

## 2021-07-30 DIAGNOSIS — N871 Moderate cervical dysplasia: Secondary | ICD-10-CM

## 2021-07-30 DIAGNOSIS — Z01812 Encounter for preprocedural laboratory examination: Secondary | ICD-10-CM | POA: Diagnosis present

## 2021-07-30 LAB — COMPREHENSIVE METABOLIC PANEL
ALT: 15 U/L (ref 0–44)
AST: 19 U/L (ref 15–41)
Albumin: 3.6 g/dL (ref 3.5–5.0)
Alkaline Phosphatase: 70 U/L (ref 38–126)
Anion gap: 6 (ref 5–15)
BUN: 16 mg/dL (ref 6–20)
CO2: 26 mmol/L (ref 22–32)
Calcium: 8.4 mg/dL — ABNORMAL LOW (ref 8.9–10.3)
Chloride: 109 mmol/L (ref 98–111)
Creatinine, Ser: 0.79 mg/dL (ref 0.44–1.00)
GFR, Estimated: >60 mL/min (ref >60)
Glucose, Bld: 97 mg/dL (ref 70–99)
Potassium: 3.5 mmol/L (ref 3.5–5.1)
Sodium: 141 mmol/L (ref 135–145)
Total Bilirubin: 0.5 mg/dL (ref 0.3–1.2)
Total Protein: 6.7 g/dL (ref 6.5–8.1)

## 2021-07-30 LAB — CBC WITH DIFFERENTIAL/PLATELET
Abs Immature Granulocytes: 0.02 10*3/uL (ref 0.00–0.07)
Basophils Absolute: 0.1 10*3/uL (ref 0.0–0.1)
Basophils Relative: 1 %
Eosinophils Absolute: 0.1 10*3/uL (ref 0.0–0.5)
Eosinophils Relative: 3 %
HCT: 29 % — ABNORMAL LOW (ref 36.0–46.0)
Hemoglobin: 8.9 g/dL — ABNORMAL LOW (ref 12.0–15.0)
Immature Granulocytes: 0 %
Lymphocytes Relative: 38 %
Lymphs Abs: 2 10*3/uL (ref 0.7–4.0)
MCH: 28.4 pg (ref 26.0–34.0)
MCHC: 30.7 g/dL (ref 30.0–36.0)
MCV: 92.7 fL (ref 80.0–100.0)
Monocytes Absolute: 0.4 10*3/uL (ref 0.1–1.0)
Monocytes Relative: 8 %
Neutro Abs: 2.7 10*3/uL (ref 1.7–7.7)
Neutrophils Relative %: 50 %
Platelets: 276 10*3/uL (ref 150–400)
RBC: 3.13 MIL/uL — ABNORMAL LOW (ref 3.87–5.11)
RDW: 14.7 % (ref 11.5–15.5)
WBC: 5.4 10*3/uL (ref 4.0–10.5)
nRBC: 0 % (ref 0.0–0.2)

## 2021-07-30 NOTE — H&P (Signed)
NAME: [name], [name]. ?MEDICAL RECORD NO: [id number] ?ACCOUNT NO: [id number] ?DATE OF BIRTH: [birth year] ?FACILITY: [facility] ?LOCATION: [facility] ?PHYSICIAN: Lucille Passy. Ulanda Edison, MD ? ?History and Physical  ? ?DATE OF ADMISSION: 08/01/2021 ? ?HISTORY OF PRESENT ILLNESS:  This is a 51 year old white female admitted for D and [name] conization because of high-grade dysplasia on cervical biopsy. On 06/03/2021, the patient was seen with abnormal bleeding, she was bleeding heavily and was placed on  ?Megace, no biopsy for the endometrium or the cervix were done because of the heavy bleeding.  Her Pap smear was returned a week or so later when the bleeding had stopped with atypical squamous cells, cannot exclude high-grade dysplasia on the Pap smear  ?with a positive HPV.  The endometrial biopsy benign, the endometrial biopsy showed disordered proliferative endometrium, negative for hyperplasia. Because of the high-grade dysplasia on cervical biopsy the patient was advised D&[name],conization of the cervix.  She was also advised  ?to return for further followup of her abnormal bleeding and missed her appointment. ? ?PAST MEDICAL HISTORY: ?ALLERGIES:  No known drug allergies. ? ?FAMILY HISTORY:  Mother had high blood pressure, high cholesterol.  The patient herself had COVID in 05/2019. ? ?SOCIAL HISTORY:  She is a never smoker, she drinks 1-2 glasses of wine a month.  She is divorced. Had an upper GI endoscopy in 10/2015.  She did have a colonoscopy in 10/2015 that showed IBS. The patient had a Pap smear in 01/2017 and had HPV positive  ?and she was advised to follow up, but she failed to do so. Sexual orientation is heterosexual.  Uses tubal ligation for birth control. ? ?PAST MEDICAL HISTORY:  Irritable bowel syndrome, anemia, shingles, gastrocolitis. ? ?PHYSICAL EXAMINATION:   ?VITAL SIGNS:  On admission 5 feet 7 inches, 101 pounds, BMI 15.8, blood pressure is normal. ?HEAD, EYES, NOSE AND THROAT:  No abnormalities. ?LUNGS:  Clear to  auscultation. ?HEART:  Normal size and sounds. ?BREASTS:  Soft, without masses. ?THYROID: Normal. ?ABDOMEN:  Soft, nontender. ?PELVIC: Vulva and vagina normal The cervix has ectropion Uterus is normal size. Adnexa free of masses. ? 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  The patient is admitted for D and [name], conization of the cervix. She has been counseled about the risks of  ?surgery including but not limited to postoperative bleeding early and late, infection, injury to surrounding structures, uterine perforation.  She understands and agrees to proceed. ? ? ?PUS ?D: 07/30/2021 6:25:12 am T: 07/30/2021 11:10:00 am  ?JOB: [id number]/ [id number]  ?

## 2021-07-30 NOTE — Progress Notes (Signed)
Received pt's pcp, Dr Elease Hashimoto, medical clearance via fax from Dr Ulanda Edison office, placed with chart. ?

## 2021-07-31 NOTE — Anesthesia Preprocedure Evaluation (Addendum)
Anesthesia Evaluation  ?Patient identified by MRN, date of birth, ID band ?Patient awake ? ? ? ?Reviewed: ?Allergy & Precautions, NPO status , Patient's Chart, lab work & pertinent test results ? ?History of Anesthesia Complications ?(+) PONV and history of anesthetic complications ? ?Airway ?Mallampati: II ? ?TM Distance: >3 FB ?Neck ROM: Full ? ? ? Dental ?no notable dental hx. ? ?  ?Pulmonary ?neg pulmonary ROS,  ?  ?Pulmonary exam normal ? ? ? ? ? ? ? Cardiovascular ?negative cardio ROS ? ? ?Rhythm:Regular Rate:Normal ? ? ?  ?Neuro/Psych ? Headaches, negative psych ROS  ? GI/Hepatic ?negative GI ROS, Neg liver ROS,   ?Endo/Other  ?negative endocrine ROS ? Renal/GU ?negative Renal ROS  ?Female GU complaint ?Cervical dysplasia  ? ?  ?Musculoskeletal ? ?(+) Arthritis , Osteoarthritis,   ? Abdominal ?Normal abdominal exam  (+)   ?Peds ? Hematology ? ?(+) Blood dyscrasia, anemia ,   ?Anesthesia Other Findings ? ? Reproductive/Obstetrics ? ?  ? ? ? ? ? ? ? ? ? ? ? ? ? ?  ?  ? ? ? ? ? ? ? ?Anesthesia Physical ?Anesthesia Plan ? ?ASA: 2 ? ?Anesthesia Plan: General  ? ?Post-op Pain Management: Tylenol PO (pre-op)* and Celebrex PO (pre-op)*  ? ?Induction: Intravenous ? ?PONV Risk Score and Plan: 4 or greater and Ondansetron, Dexamethasone, Midazolam, Scopolamine patch - Pre-op and Amisulpride ? ?Airway Management Planned: Mask and LMA ? ?Additional Equipment: None ? ?Intra-op Plan:  ? ?Post-operative Plan: Extubation in OR ? ?Informed Consent: I have reviewed the patients History and Physical, chart, labs and discussed the procedure including the risks, benefits and alternatives for the proposed anesthesia with the patient or authorized representative who has indicated his/her understanding and acceptance.  ? ? ? ?Dental advisory given ? ?Plan Discussed with: CRNA ? ?Anesthesia Plan Comments:   ? ? ? ? ? ?Anesthesia Quick Evaluation ? ?

## 2021-08-01 ENCOUNTER — Ambulatory Visit (HOSPITAL_BASED_OUTPATIENT_CLINIC_OR_DEPARTMENT_OTHER): Payer: Managed Care, Other (non HMO) | Admitting: Anesthesiology

## 2021-08-01 ENCOUNTER — Encounter (HOSPITAL_BASED_OUTPATIENT_CLINIC_OR_DEPARTMENT_OTHER): Payer: Self-pay | Admitting: Obstetrics and Gynecology

## 2021-08-01 ENCOUNTER — Other Ambulatory Visit: Payer: Self-pay

## 2021-08-01 ENCOUNTER — Encounter (HOSPITAL_BASED_OUTPATIENT_CLINIC_OR_DEPARTMENT_OTHER)
Admission: RE | Disposition: A | Discharge status: Home / Self Care | Payer: Self-pay | Source: Home / Self Care | Attending: Obstetrics and Gynecology

## 2021-08-01 ENCOUNTER — Ambulatory Visit (HOSPITAL_BASED_OUTPATIENT_CLINIC_OR_DEPARTMENT_OTHER)
Admission: RE | Admit: 2021-08-01 | Discharge: 2021-08-01 | Disposition: A | Discharge status: Home / Self Care | Payer: Managed Care, Other (non HMO) | Attending: Obstetrics and Gynecology | Admitting: Obstetrics and Gynecology

## 2021-08-01 DIAGNOSIS — D759 Disease of blood and blood-forming organs, unspecified: Secondary | ICD-10-CM | POA: Diagnosis not present

## 2021-08-01 DIAGNOSIS — D649 Anemia, unspecified: Secondary | ICD-10-CM | POA: Diagnosis not present

## 2021-08-01 DIAGNOSIS — N871 Moderate cervical dysplasia: Secondary | ICD-10-CM | POA: Insufficient documentation

## 2021-08-01 DIAGNOSIS — R87613 High grade squamous intraepithelial lesion on cytologic smear of cervix (HGSIL): Secondary | ICD-10-CM

## 2021-08-01 HISTORY — DX: Nausea with vomiting, unspecified: Z98.890

## 2021-08-01 HISTORY — DX: Other specified postprocedural states: R11.2

## 2021-08-01 HISTORY — DX: Hypotension, unspecified: I95.9

## 2021-08-01 HISTORY — PX: CERVICAL CONIZATION W/BX: SHX1330

## 2021-08-01 HISTORY — DX: Anemia, unspecified: D64.9

## 2021-08-01 LAB — TYPE AND SCREEN
ABO/RH(D): O POS
Antibody Screen: NEGATIVE

## 2021-08-01 LAB — ABO/RH: ABO/RH(D): O POS

## 2021-08-01 LAB — POCT PREGNANCY, URINE: Preg Test, Ur: NEGATIVE

## 2021-08-01 SURGERY — CONE BIOPSY, CERVIX
Anesthesia: General | Site: Cervix

## 2021-08-01 MED ORDER — FENTANYL CITRATE (PF) 100 MCG/2ML IJ SOLN
25.0000 ug | INTRAMUSCULAR | Status: DC | PRN
  Administered 2021-08-01: 50 ug via INTRAVENOUS

## 2021-08-01 MED ORDER — ACETAMINOPHEN 500 MG PO TABS
ORAL_TABLET | ORAL | Status: AC
  Filled 2021-08-01: qty 2

## 2021-08-01 MED ORDER — PROPOFOL 500 MG/50ML IV EMUL
INTRAVENOUS | Status: DC | PRN
  Administered 2021-08-01: 125 ug/kg/min via INTRAVENOUS

## 2021-08-01 MED ORDER — MIDAZOLAM HCL 2 MG/2ML IJ SOLN
INTRAMUSCULAR | Status: AC
Start: 2021-08-01 — End: ?
  Filled 2021-08-01: qty 2

## 2021-08-01 MED ORDER — LIDOCAINE HCL (CARDIAC) PF 100 MG/5ML IV SOSY
PREFILLED_SYRINGE | INTRAVENOUS | Status: DC | PRN
  Administered 2021-08-01: 40 mg via INTRAVENOUS

## 2021-08-01 MED ORDER — FENTANYL CITRATE (PF) 100 MCG/2ML IJ SOLN
INTRAMUSCULAR | Status: AC
  Filled 2021-08-01: qty 2

## 2021-08-01 MED ORDER — AMISULPRIDE (ANTIEMETIC) 5 MG/2ML IV SOLN: 10.0000 mg | Freq: Once | INTRAVENOUS | Status: DC | PRN

## 2021-08-01 MED ORDER — ONDANSETRON HCL 4 MG/2ML IJ SOLN
INTRAMUSCULAR | Status: DC | PRN
  Administered 2021-08-01: 4 mg via INTRAVENOUS

## 2021-08-01 MED ORDER — ACETAMINOPHEN 500 MG PO TABS
1000.0000 mg | ORAL_TABLET | Freq: Once | ORAL | Status: AC
  Administered 2021-08-01: 1000 mg via ORAL

## 2021-08-01 MED ORDER — SCOPOLAMINE 1 MG/3DAYS TD PT72
MEDICATED_PATCH | TRANSDERMAL | Status: AC
  Filled 2021-08-01: qty 1

## 2021-08-01 MED ORDER — EPHEDRINE SULFATE (PRESSORS) 50 MG/ML IJ SOLN
INTRAMUSCULAR | Status: DC | PRN
  Administered 2021-08-01: 10 mg via INTRAVENOUS

## 2021-08-01 MED ORDER — FERRIC SUBSULFATE (BULK) SOLN
Status: DC | PRN
  Administered 2021-08-01: 1

## 2021-08-01 MED ORDER — CELECOXIB 200 MG PO CAPS
200.0000 mg | ORAL_CAPSULE | Freq: Once | ORAL | Status: AC
  Administered 2021-08-01: 200 mg via ORAL

## 2021-08-01 MED ORDER — MIDAZOLAM HCL 2 MG/2ML IJ SOLN
INTRAMUSCULAR | Status: DC | PRN
  Administered 2021-08-01: 2 mg via INTRAVENOUS

## 2021-08-01 MED ORDER — 0.9 % SODIUM CHLORIDE (POUR BTL) OPTIME
TOPICAL | Status: DC | PRN
  Administered 2021-08-01: 500 mL

## 2021-08-01 MED ORDER — VASOPRESSIN 20 UNIT/ML IV SOLN
INTRAVENOUS | Status: DC | PRN
  Administered 2021-08-01: 1.4 [IU]

## 2021-08-01 MED ORDER — SCOPOLAMINE 1 MG/3DAYS TD PT72
1.0000 | MEDICATED_PATCH | TRANSDERMAL | Status: DC
  Administered 2021-08-01: 1.5 mg via TRANSDERMAL

## 2021-08-01 MED ORDER — SODIUM CHLORIDE (PF) 0.9 % IJ SOLN
INTRAMUSCULAR | Status: DC | PRN
  Administered 2021-08-01: 14 mL

## 2021-08-01 MED ORDER — DEXAMETHASONE SODIUM PHOSPHATE 4 MG/ML IJ SOLN
INTRAMUSCULAR | Status: DC | PRN
Start: 2021-08-01 — End: 2021-08-01
  Administered 2021-08-01: 8 mg via INTRAVENOUS

## 2021-08-01 MED ORDER — PROPOFOL 500 MG/50ML IV EMUL
INTRAVENOUS | Status: AC
  Filled 2021-08-01: qty 50

## 2021-08-01 MED ORDER — CELECOXIB 200 MG PO CAPS
ORAL_CAPSULE | ORAL | Status: AC
  Filled 2021-08-01: qty 1

## 2021-08-01 MED ORDER — PROPOFOL 10 MG/ML IV BOLUS
INTRAVENOUS | Status: DC | PRN
  Administered 2021-08-01: 130 mg via INTRAVENOUS

## 2021-08-01 MED ORDER — LACTATED RINGERS IV SOLN: INTRAVENOUS | Status: DC

## 2021-08-01 MED ORDER — FENTANYL CITRATE (PF) 100 MCG/2ML IJ SOLN
INTRAMUSCULAR | Status: DC | PRN
Start: 2021-08-01 — End: 2021-08-01
  Administered 2021-08-01 (×2): 50 ug via INTRAVENOUS

## 2021-08-01 MED ORDER — POVIDONE-IODINE 10 % EX SWAB: 2.0000 "application " | Freq: Once | CUTANEOUS | Status: DC

## 2021-08-01 MED ORDER — GLYCOPYRROLATE 0.2 MG/ML IJ SOLN
INTRAMUSCULAR | Status: DC | PRN
  Administered 2021-08-01: .1 mg via INTRAVENOUS

## 2021-08-01 SURGICAL SUPPLY — 30 items
APL SWBSTK 6 STRL LF DISP (MISCELLANEOUS) ×2
APPLICATOR COTTON TIP 6 STRL (MISCELLANEOUS) ×2 IMPLANT
APPLICATOR COTTON TIP 6IN STRL (MISCELLANEOUS) ×4
BLADE SURG 15 STRL LF DISP TIS (BLADE) IMPLANT
BLADE SURG 15 STRL SS (BLADE) ×2
CANISTER SUCT 1200ML W/VALVE (MISCELLANEOUS) IMPLANT
CATH ROBINSON RED A/P 16FR (CATHETERS) ×1 IMPLANT
ELECT BALL LEEP 3MM BLK (ELECTRODE) IMPLANT
ELECT BALL LEEP 5MM RED (ELECTRODE) IMPLANT
ELECT NDL TIP 2.8 STRL (NEEDLE) IMPLANT
ELECT NEEDLE TIP 2.8 STRL (NEEDLE) IMPLANT
GAUZE 4X4 16PLY ~~LOC~~+RFID DBL (SPONGE) ×3 IMPLANT
GLOVE ECLIPSE 7.5 STRL STRAW (GLOVE) ×2 IMPLANT
GLOVE ECLIPSE 8.0 STRL XLNG CF (GLOVE) ×2 IMPLANT
KIT TURNOVER CYSTO (KITS) ×2 IMPLANT
NDL HYPO 25X1 1.5 SAFETY (NEEDLE) IMPLANT
NDL SAFETY ECLIPSE 18X1.5 (NEEDLE) ×1 IMPLANT
NEEDLE HYPO 18GX1.5 SHARP (NEEDLE) ×2
NEEDLE HYPO 25X1 1.5 SAFETY (NEEDLE) ×2 IMPLANT
NS IRRIG 500ML POUR BTL (IV SOLUTION) ×1 IMPLANT
PACK VAGINAL WOMENS (CUSTOM PROCEDURE TRAY) ×2 IMPLANT
PAD OB MATERNITY 4.3X12.25 (PERSONAL CARE ITEMS) ×2 IMPLANT
PAD PREP 24X48 CUFFED NSTRL (MISCELLANEOUS) ×2 IMPLANT
PENCIL SMOKE EVACUATOR (MISCELLANEOUS) ×1 IMPLANT
SCOPETTES 8  STERILE (MISCELLANEOUS) ×2
SCOPETTES 8 STERILE (MISCELLANEOUS) ×2 IMPLANT
SUT CHROMIC 0 CT 1 (SUTURE) ×5 IMPLANT
SYR TB 1ML LL NO SAFETY (SYRINGE) ×1 IMPLANT
TOWEL OR 17X26 10 PK STRL BLUE (TOWEL DISPOSABLE) ×2 IMPLANT
WATER STERILE IRR 500ML POUR (IV SOLUTION) ×2 IMPLANT

## 2021-08-01 NOTE — Transfer of Care (Signed)
Immediate Anesthesia Transfer of Care Note ? ?Patient: [name] ? ?Procedure(s) Performed: CONIZATION CERVIX WITH BIOPSY AND D&C (Cervix) ? ?Patient Location: PACU ? ?Anesthesia Type:General ? ?Level of Consciousness: awake and patient cooperative ? ?Airway & Oxygen Therapy: Patient Spontanous Breathing and Patient connected to nasal cannula oxygen ? ?Post-op Assessment: Report given to RN and Post -op Vital signs reviewed and stable ? ?Post vital signs: Reviewed and stable ? ?Last Vitals:  ?Vitals Value Taken Time  ?BP 110/75 08/01/21 1025  ?Temp 35.7 ?C 08/01/21 1025  ?Pulse 63 08/01/21 1027  ?Resp 15 08/01/21 1027  ?SpO2 100 % 08/01/21 1027  ?Vitals shown include unvalidated device data. ? ?Last Pain:  ?Vitals:  ? 08/01/21 0712  ?TempSrc: Oral  ?PainSc: 0-No pain  ?   ? ?Patients Stated Pain Goal: 6 (08/01/21 9417) ? ?Complications: No notable events documented. ?

## 2021-08-01 NOTE — Anesthesia Postprocedure Evaluation (Signed)
Anesthesia Post Note ? ?Patient: [name] ? ?Procedure(s) Performed: CONIZATION CERVIX WITH BIOPSY AND D&C (Cervix) ? ?  ? ?Patient location during evaluation: PACU ?Anesthesia Type: General ?Level of consciousness: awake and alert ?Pain management: pain level controlled ?Vital Signs Assessment: post-procedure vital signs reviewed and stable ?Respiratory status: spontaneous breathing, nonlabored ventilation, respiratory function stable and patient connected to nasal cannula oxygen ?Cardiovascular status: blood pressure returned to baseline and stable ?Postop Assessment: no apparent nausea or vomiting ?Anesthetic complications: no ? ? ?No notable events documented. ? ?Last Vitals:  ?Vitals:  ? 08/01/21 1115 08/01/21 1155  ?BP: (!) 85/62 (!) 101/52  ?Pulse: (!) 42 (!) 47  ?Resp: 14 15  ?Temp:  (!) 36.3 ?C  ?SpO2: 97% 99%  ?  ?Last Pain:  ?Vitals:  ? 08/01/21 1155  ?TempSrc:   ?PainSc: 4   ? ? ?  ?  ?  ?  ?  ?  ? ?[name] ? ? ? ? ?

## 2021-08-01 NOTE — Anesthesia Procedure Notes (Signed)
Procedure Name: LMA Insertion ?Date/Time: 08/01/2021 9:01 AM ?Performed by: Georgeanne Nim, CRNA ?Pre-anesthesia Checklist: Patient identified, Emergency Drugs available, Suction available, Patient being monitored and Timeout performed ?Patient Re-evaluated:Patient Re-evaluated prior to induction ?Oxygen Delivery Method: Circle system utilized ?Preoxygenation: Pre-oxygenation with 100% oxygen ?Induction Type: IV induction ?Ventilation: Mask ventilation without difficulty ?LMA: LMA inserted ?LMA Size: 3.0 ?Number of attempts: 1 ?Placement Confirmation: positive ETCO2, CO2 detector and breath sounds checked- equal and bilateral ?Tube secured with: Tape ?Dental Injury: Teeth and Oropharynx as per pre-operative assessment  ? ? ? ? ?

## 2021-08-01 NOTE — Progress Notes (Signed)
I examined this 07-31-21 and she reports no change in her health since that time. ?

## 2021-08-01 NOTE — Progress Notes (Signed)
Op note: D&C Conization of the cervix Dx: HGSIL of cervix ? Operator: Ulanda Edison  ?General anesthesia EBL 50 cc's  ?  No known complications ?

## 2021-08-01 NOTE — Op Note (Signed)
NAME: [name], [name] MEDICAL RECORD NO: [id number] ACCOUNT NO: [id number] DATE OF BIRTH: 12/29/1970 FACILITY: [facility] LOCATION: [facility] PHYSICIAN: Lucille Passy. 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  OPERATION:  D and C, conization of the cervix.  OPERATOR:  Lucille Passy. Ulanda Edison, MD  ANESTHESIA:  General.  DESCRIPTION OF PROCEDURE:  The patient was brought to the operating room and placed under satisfactory general anesthesia with a nonintubated airway.  A timeout was done, identifying the patient, the procedure to be done, allergies, and the fact that we  had Pitressin on the table.  The patient was then placed in the [place] and in lithotomy position. The nurse prepared the vulva, vagina and urethra and emptied the bladder with a [place] catheter in and out.  The area was then draped as a sterile  field.  A weighted speculum was placed posteriorly.  Exam revealed the uterus to be anterior, small.  Adnexa free of masses.  There were a couple of nodules in the pelvis that could be small fibroids or could be endometriosis on the uterosacral  ligaments. As known preoperatively the patient's squamocolumnar junction was far out on the cervix, so we identified the squamocolumnar junction and tried to identify an area that would give [place] a margin on the vaginal side of the squamocolumnar junction.   I then injected this circumferential area with a solution of one of Pitressin and saline, 10 units of Pitressin in 100 mL of saline and I used about 12 mL of the material. I injected to try to establish hemostasis with vasoconstriction, the outline of  the conization specimen was then done with a 15 blade knife.  The tissue did not grasp well.  I used a combination of instruments including pickups with teeth,Allis  clamps and single tooth tenaculum to try to grasp the tissue and hold it with traction  to facilitate the removal of the cone. Because the squamocolumnar junction was so far out on the cervix I made a special effort to protect all surrounding tissues and instead of doing a true conization of the cervix I tried to do more of a cap not going  too deep into the tissue.  The dissection was long and difficult because of the aforementioned problems, but I ultimately was able to remove the cone specimen.  It actually was removed in 2 pieces.  There was also a little tag of tissue that I removed  separately.  I then did an endocervical curette producing minimal if any tissue and then I did an endometrial curettage after dilating the cervix enough that it would admit the serrated curette and found strips of endometrial tissue.  There was no  significant bleeding at any spot except at 6 o'clock in the cone bed.  I used 3 or 4 sutures of 0 Vicryl to try to control this before I reapproximated the cervix.  After I felt comfortable that I had completely controlled this bleeding I used the Bovie  to try to establish hemostasis on superficial bleeding points along the cone bed and along the cervicovaginal junction. I then reapproximated the cervix with 4 interrupted sutures of 0 chromic catgut.  I placed these sutures from 4 o'clock to 8 o'clock,  10 o'clock to 2 o'clock, 2 o'clock to 4 o'clock and 8 o'clock to 10 o'clock. At the end of  the procedure, there was no bleeding.  I did place Monsel's into the endocervix and the procedure was terminated.  There was no blood in the suction.  We did not  use any gauze pads to collect blood, but we estimated blood loss at maybe 50 mL.  Sponge and needle counts were correct at the end of the procedure and the patient was returned to recovery in satisfactory condition.  I will see her in recovery. Per the  nurse anesthetist the patient's condition remained stable throughout  surgery.   PUS D: 08/01/2021 10:20:42 am T: 08/01/2021 11:43:00 am  JOB: [id number]/ [id number]

## 2021-08-01 NOTE — Discharge Instructions (Addendum)
No vaginal entrance for six weeks. Call with heavy bleeding or any problems. ? ? ? ?Post Anesthesia Home Care Instructions ? ?Activity: ?Get plenty of rest for the remainder of the day. A responsible individual must stay with you for 24 hours following the procedure.  ?For the next 24 hours, DO NOT: ?-Drive a car ?-Paediatric nurse ?-Drink alcoholic beverages ?-Take any medication unless instructed by your physician ?-Make any legal decisions or sign important papers. ? ?Meals: ?Start with liquid foods such as gelatin or soup. Progress to regular foods as tolerated. Avoid greasy, spicy, heavy foods. If nausea and/or vomiting occur, drink only clear liquids until the nausea and/or vomiting subsides. Call your physician if vomiting continues. ? ?Special Instructions/Symptoms: ?Your throat may feel dry or sore from the anesthesia or the breathing tube placed in your throat during surgery. If this causes discomfort, gargle with warm salt water. The discomfort should disappear within 24 hours. ? ?If you had a scopolamine patch placed behind your ear for the management of post- operative nausea and/or vomiting: ? ?1. The medication in the patch is effective for 72 hours, after which it should be removed.  Wrap patch in a tissue and discard in the trash. Wash hands thoroughly with soap and water. ?2. You may remove the patch earlier than 72 hours if you experience unpleasant side effects which may include dry mouth, dizziness or visual disturbances. ?3. Avoid touching the patch. Wash your hands with soap and water after contact with the patch. ?    ? ? ? ?

## 2021-08-02 ENCOUNTER — Encounter (HOSPITAL_BASED_OUTPATIENT_CLINIC_OR_DEPARTMENT_OTHER): Payer: Self-pay | Admitting: Obstetrics and Gynecology

## 2021-08-02 LAB — SURGICAL PATHOLOGY

## 2021-09-19 IMAGING — US US BREAST*R* LIMITED INC AXILLA
1 series · 6 of 6 positions shown · non-contrast
Comparison: Previous exam(s).

CLINICAL DATA: Patient describes a tender palpable lump in the
outer RIGHT breast. History of benign LEFT breast biopsy on
02/28/2019 revealing fibrocystic changes and pseudoangiomatous
stromal hyperplasia (PASH).

EXAM:
DIGITAL DIAGNOSTIC RIGHT MAMMOGRAM WITH CAD AND TOMO
ULTRASOUND RIGHT BREAST

[Series 1: us breast*right* limited inc axilla · 0.05mm/px · 6 of 6 slices shown]
[im 1/6]
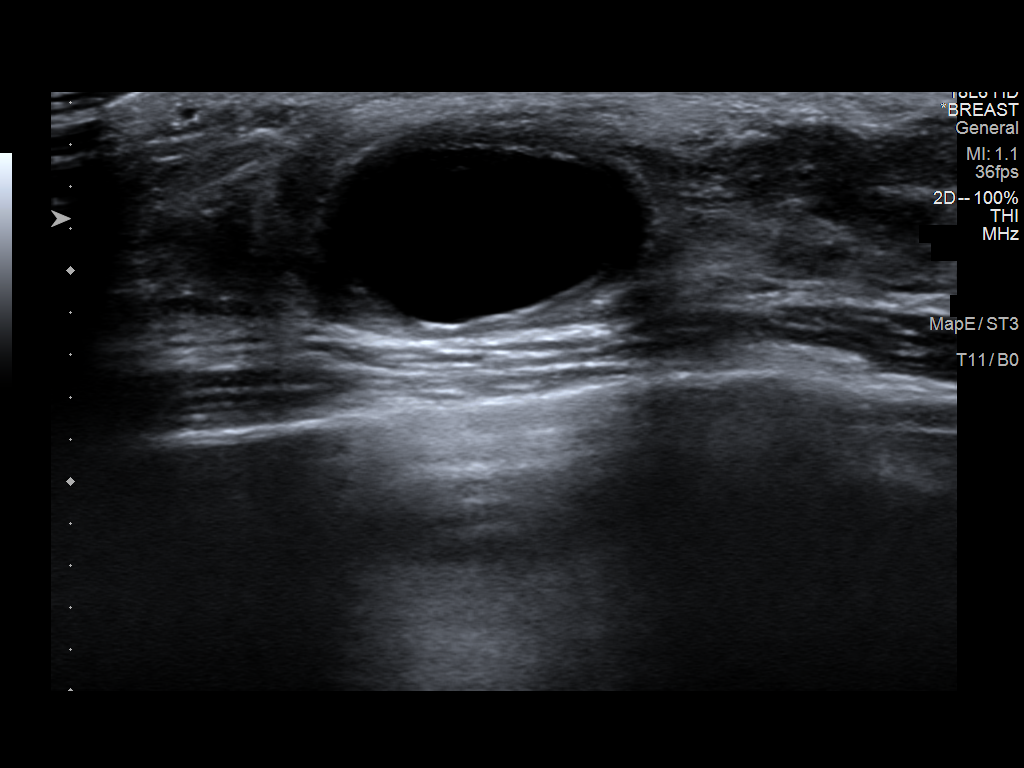
[im 2/6]
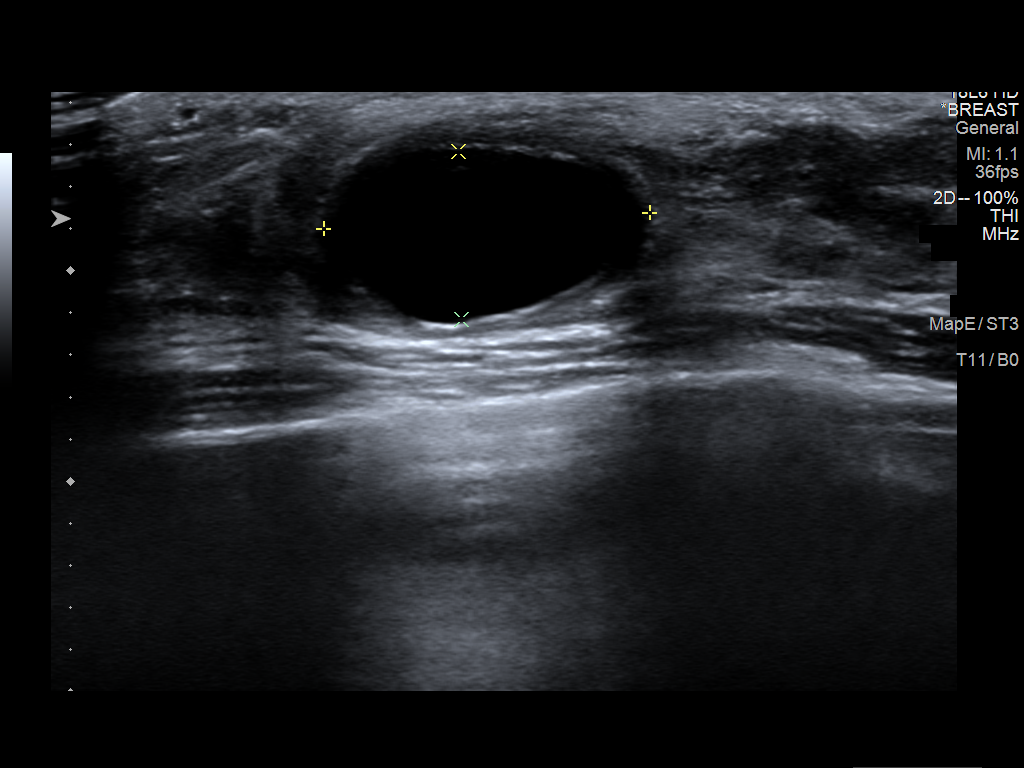
[im 3/6]
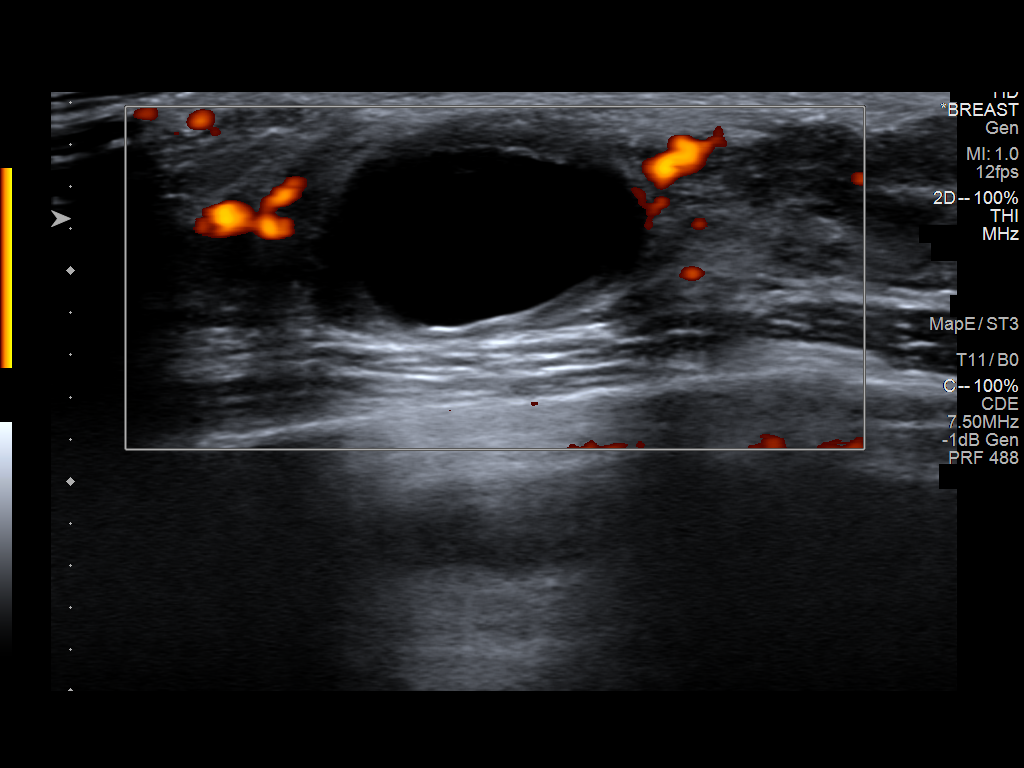
[im 4/6]
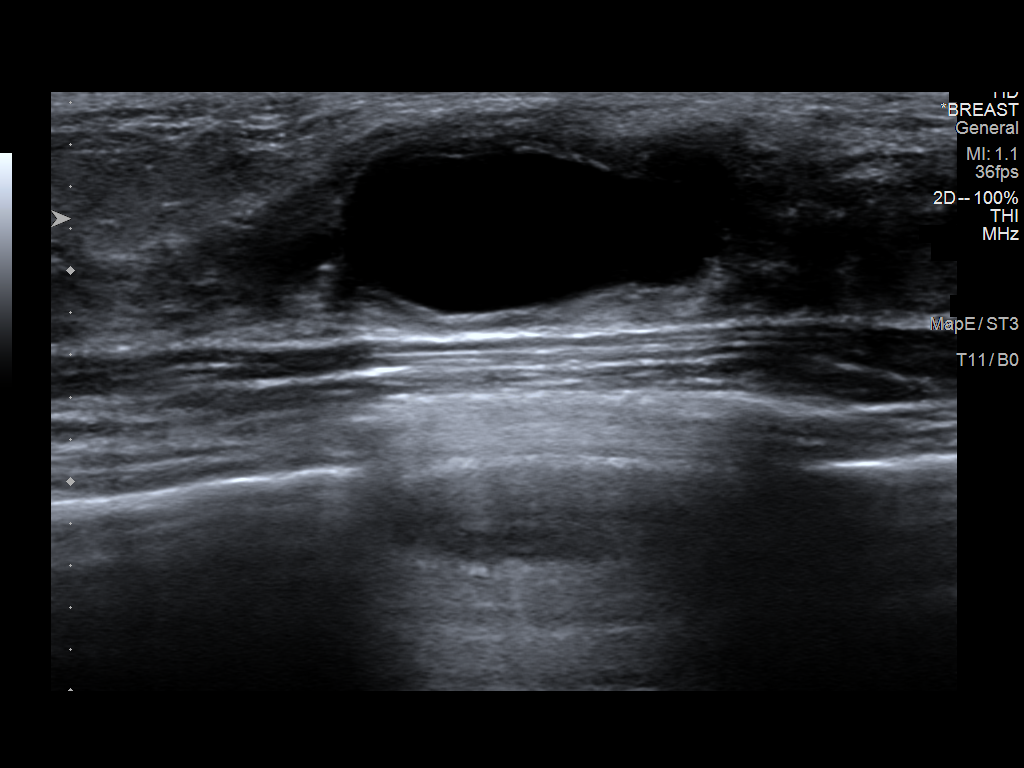
[im 5/6]
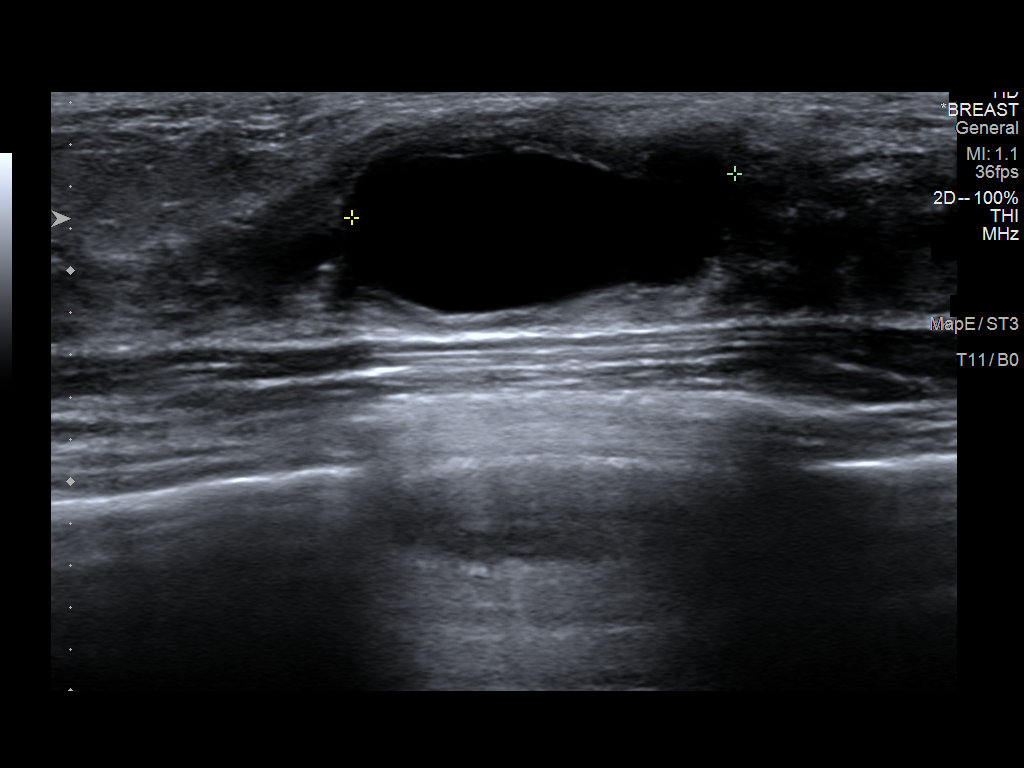
[im 6/6]
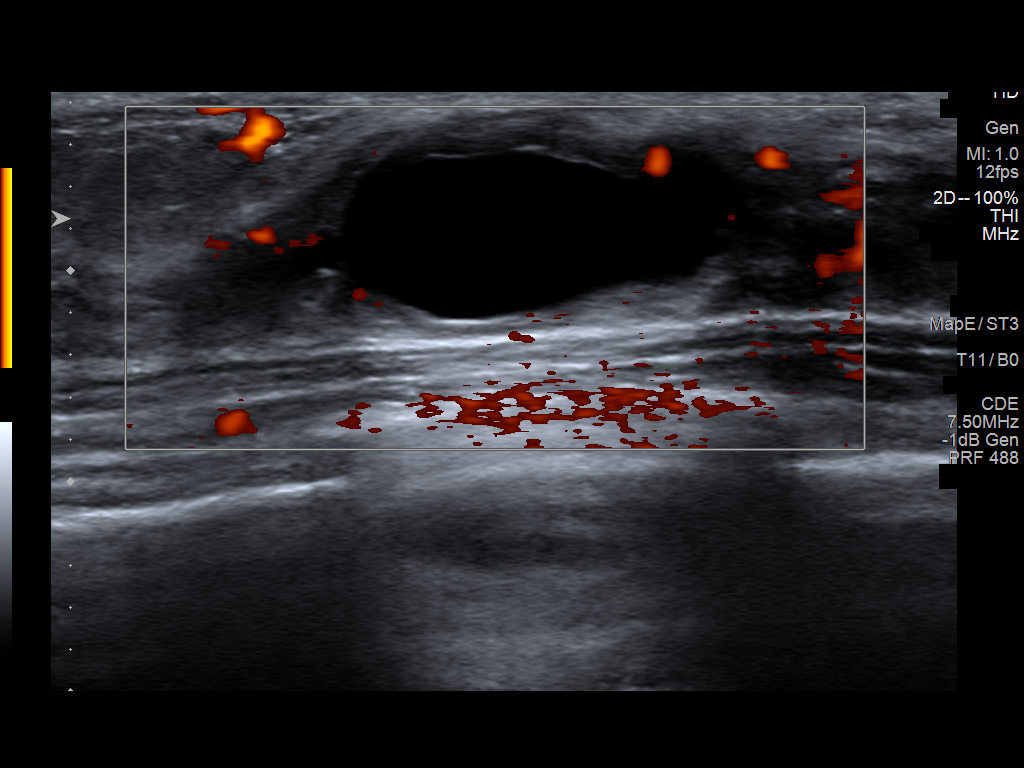

[6 of 6 positions shown; findings below may reference images not displayed]

ACR Breast Density Category c: The breast tissue is heterogeneously
dense, which may obscure small masses.
FINDINGS: There is a partially obscured mass within the outer RIGHT breast,
measuring approximately 2 cm greatest dimension, corresponding to
the area of clinical concern with overlying skin marker in place.

Mammographic images were processed with CAD.

Targeted ultrasound is performed, showing a benign simple cyst in
the RIGHT breast at the 9:30 o'clock axis, 4 cm from the nipple,
measuring 1.8 cm, corresponding to the tender palpable lump.
IMPRESSION: 1. No evidence of malignancy.
2. Benign simple cyst in the RIGHT breast at the 9:30 o'clock axis,
measuring 1.8 cm, corresponding to the patient's tender palpable
lump. Patient requests ultrasound-guided aspiration for symptomatic
relief.

RECOMMENDATION:
Therapeutic ultrasound-guided aspiration of the RIGHT breast cyst.

Ultrasound-guided aspiration is scheduled for [REDACTED].

I have discussed the findings and recommendations with the patient.
If applicable, a reminder letter will be sent to the patient
regarding the next appointment.

BI-RADS CATEGORY  2: Benign.

## 2021-09-19 IMAGING — MG MM DIGITAL DIAGNOSTIC UNILAT*R* W/ TOMO W/ CAD
6 series · 6 of 18 positions shown · non-contrast
Comparison: Previous exam(s).

CLINICAL DATA: Patient describes a tender palpable lump in the
outer RIGHT breast. History of benign LEFT breast biopsy on
02/28/2019 revealing fibrocystic changes and pseudoangiomatous
stromal hyperplasia (PASH).

EXAM:
DIGITAL DIAGNOSTIC RIGHT MAMMOGRAM WITH CAD AND TOMO
ULTRASOUND RIGHT BREAST

[R MLO synth-2D]
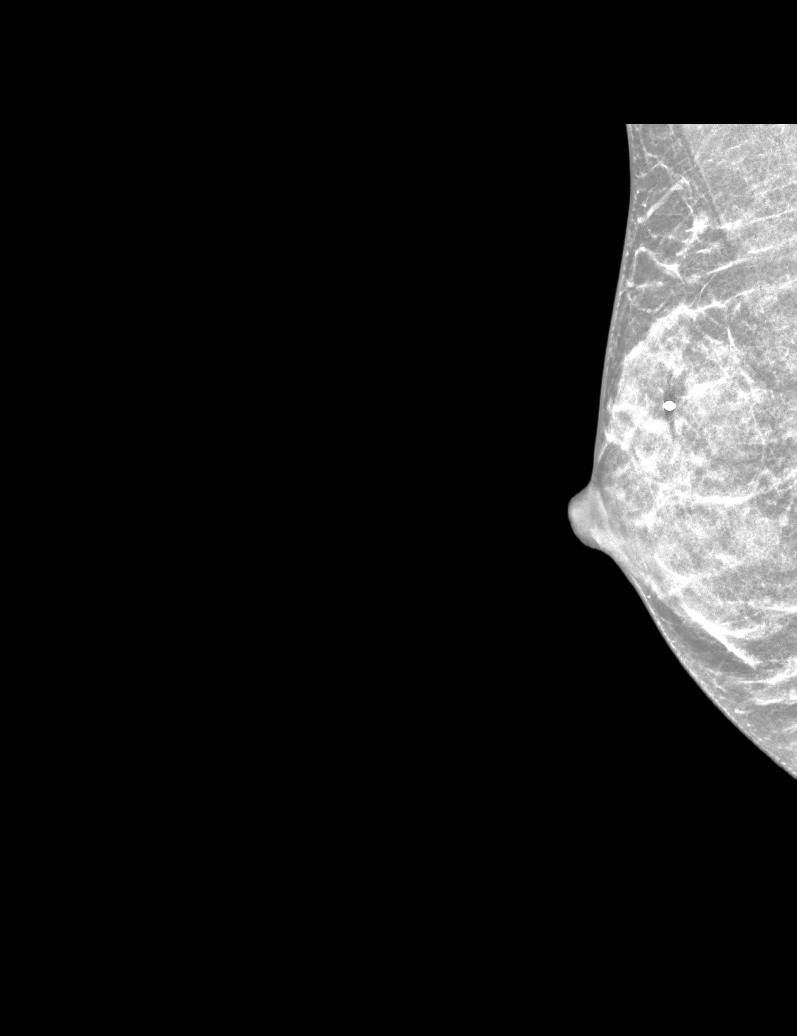

[R CC synth-2D]
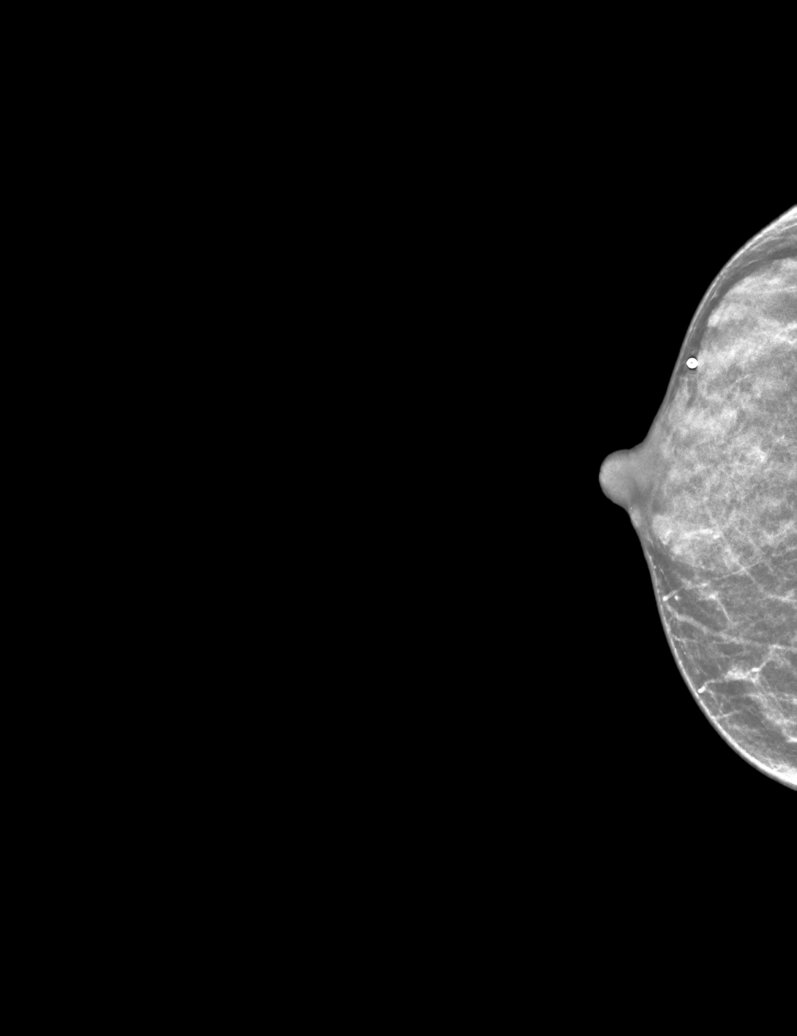

[R TAN synth-2D]
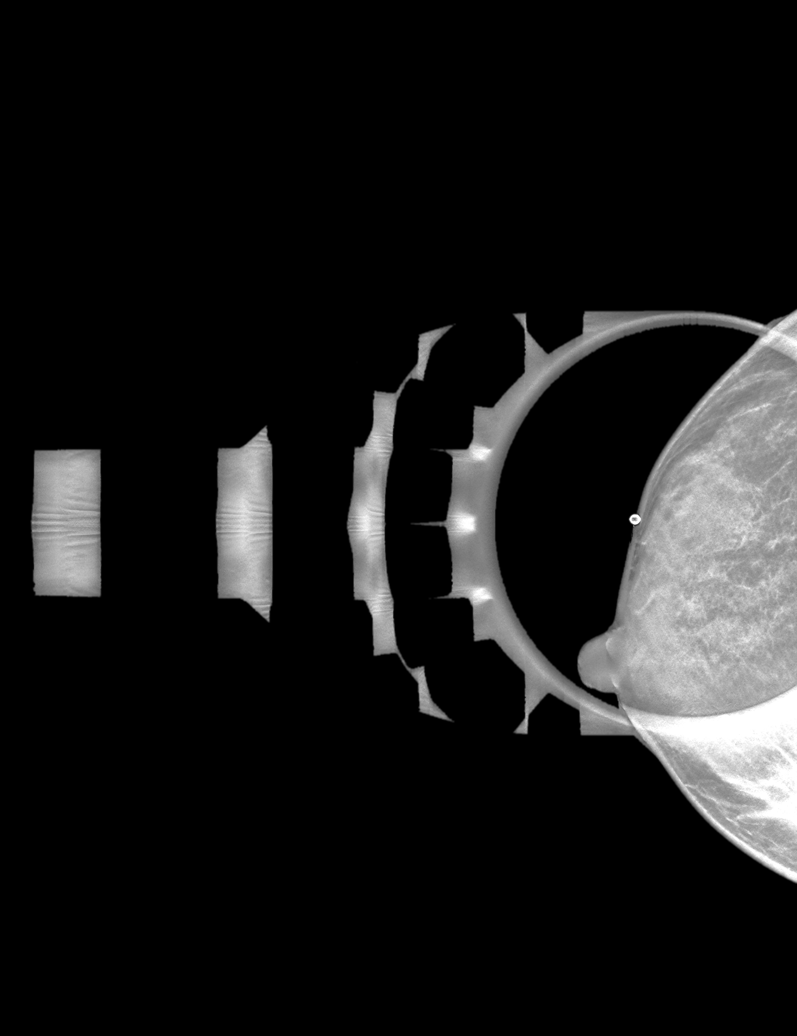

[R TAN tomo · tomo slice 15/29.0]
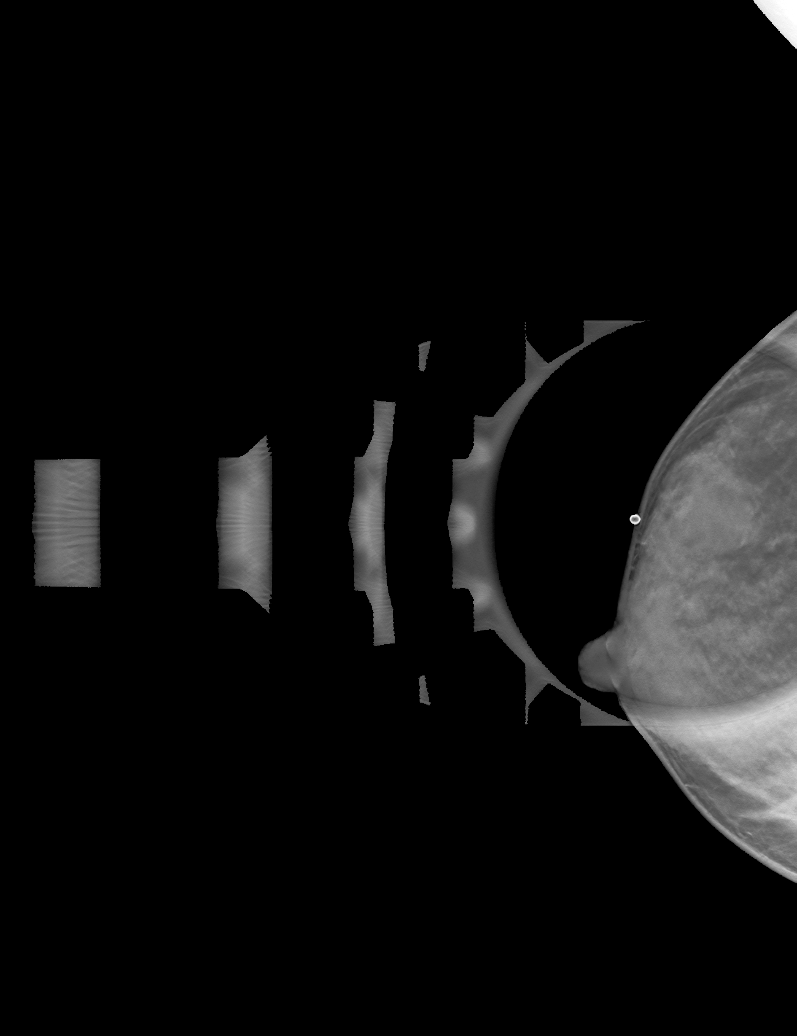

[R MLO tomo · tomo slice 17/33.0]
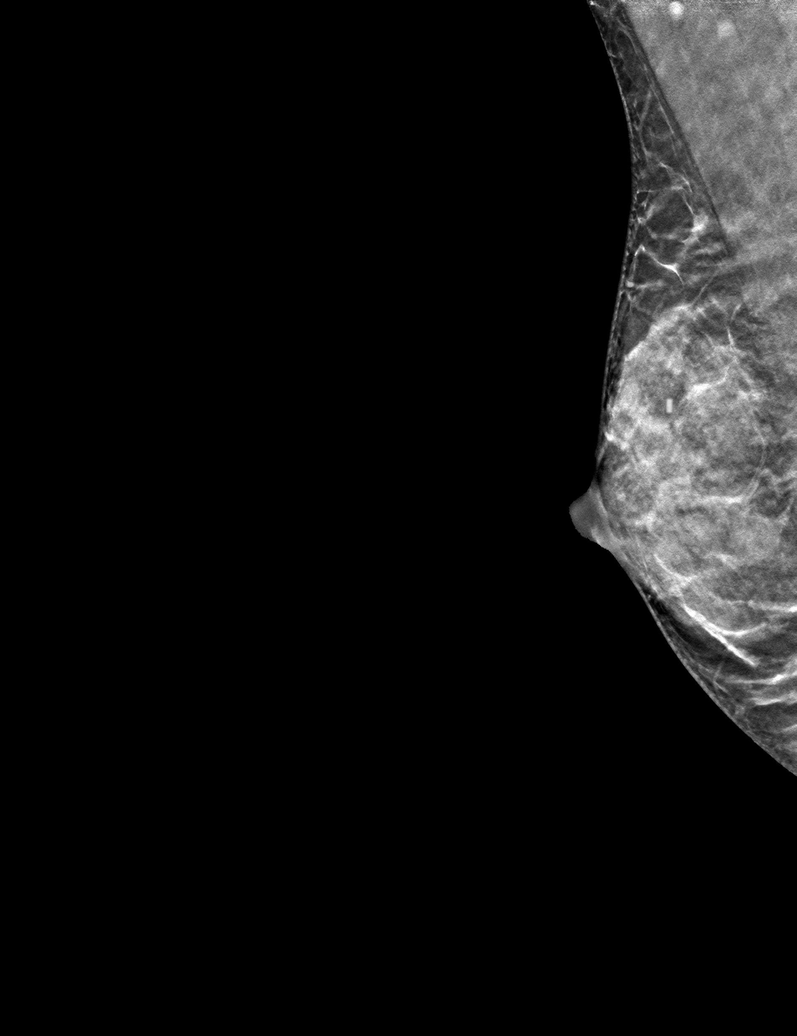

[R CC tomo · tomo slice 17/32.0]
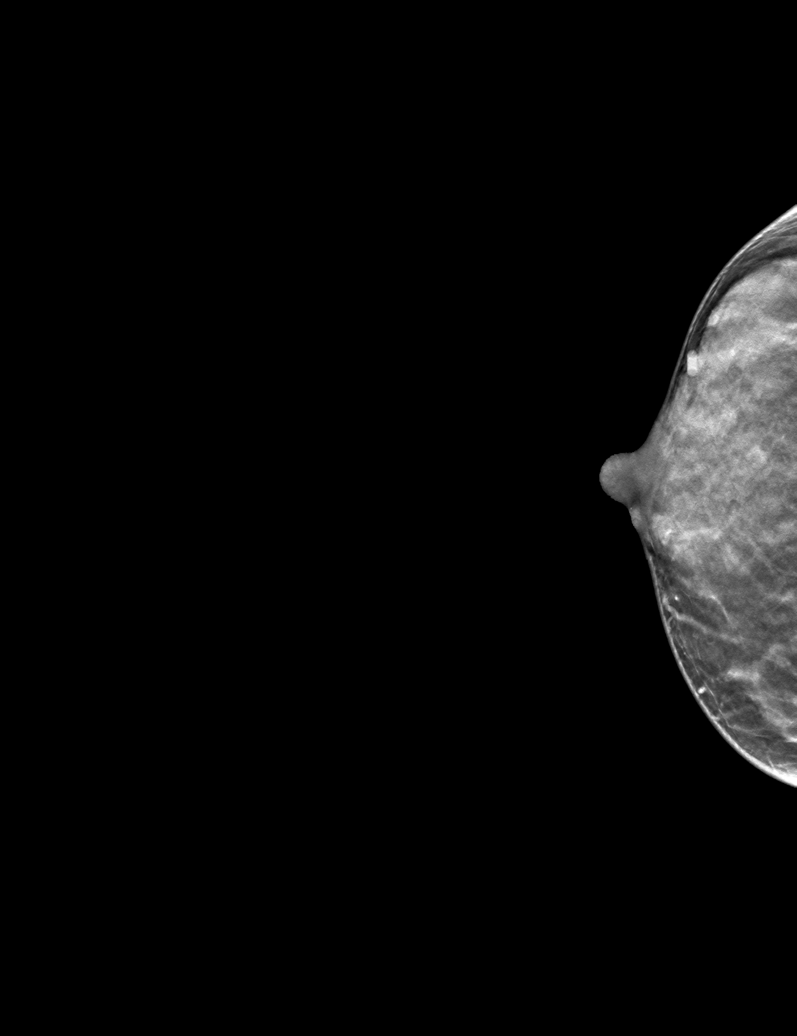

[6 of 18 positions shown; findings below may reference images not displayed]

ACR Breast Density Category c: The breast tissue is heterogeneously
dense, which may obscure small masses.
FINDINGS: There is a partially obscured mass within the outer RIGHT breast,
measuring approximately 2 cm greatest dimension, corresponding to
the area of clinical concern with overlying skin marker in place.

Mammographic images were processed with CAD.

Targeted ultrasound is performed, showing a benign simple cyst in
the RIGHT breast at the 9:30 o'clock axis, 4 cm from the nipple,
measuring 1.8 cm, corresponding to the tender palpable lump.
IMPRESSION: 1. No evidence of malignancy.
2. Benign simple cyst in the RIGHT breast at the 9:30 o'clock axis,
measuring 1.8 cm, corresponding to the patient's tender palpable
lump. Patient requests ultrasound-guided aspiration for symptomatic
relief.

RECOMMENDATION:
Therapeutic ultrasound-guided aspiration of the RIGHT breast cyst.

Ultrasound-guided aspiration is scheduled for [REDACTED].

I have discussed the findings and recommendations with the patient.
If applicable, a reminder letter will be sent to the patient
regarding the next appointment.

BI-RADS CATEGORY  2: Benign.

## 2021-10-07 ENCOUNTER — Telehealth: Payer: Self-pay | Admitting: Family Medicine

## 2021-10-07 ENCOUNTER — Encounter: Payer: Self-pay | Admitting: Internal Medicine

## 2021-10-07 ENCOUNTER — Telehealth (INDEPENDENT_AMBULATORY_CARE_PROVIDER_SITE_OTHER): Payer: Managed Care, Other (non HMO) | Admitting: Internal Medicine

## 2021-10-07 DIAGNOSIS — J22 Unspecified acute lower respiratory infection: Secondary | ICD-10-CM

## 2021-10-07 DIAGNOSIS — Z8616 Personal history of COVID-19: Secondary | ICD-10-CM

## 2021-10-07 DIAGNOSIS — U071 COVID-19: Secondary | ICD-10-CM

## 2021-10-07 MED ORDER — AMOXICILLIN 500 MG PO CAPS: 500.0000 mg | ORAL_CAPSULE | Freq: Three times a day (TID) | ORAL | 0 refills | Status: AC

## 2021-12-02 NOTE — Telephone Encounter (Signed)
Please Disregard - Error

## 2021-12-11 ENCOUNTER — Ambulatory Visit (INDEPENDENT_AMBULATORY_CARE_PROVIDER_SITE_OTHER): Payer: Managed Care, Other (non HMO) | Admitting: Emergency Medicine

## 2021-12-11 ENCOUNTER — Encounter: Payer: Self-pay | Admitting: Emergency Medicine

## 2021-12-11 VITALS — BP 102/64 | HR 81 | Temp 98.0°F | Ht 67.0 in | Wt 99.5 lb

## 2021-12-11 DIAGNOSIS — G44201 Tension-type headache, unspecified, intractable: Secondary | ICD-10-CM

## 2021-12-11 DIAGNOSIS — R509 Fever, unspecified: Secondary | ICD-10-CM

## 2021-12-11 DIAGNOSIS — R6883 Chills (without fever): Secondary | ICD-10-CM | POA: Diagnosis not present

## 2021-12-11 DIAGNOSIS — R059 Cough, unspecified: Secondary | ICD-10-CM | POA: Diagnosis not present

## 2021-12-11 LAB — POC COVID19 BINAXNOW: SARS Coronavirus 2 Ag: NEGATIVE

## 2021-12-11 MED ORDER — KETOROLAC TROMETHAMINE 60 MG/2ML IM SOLN
60.0000 mg | Freq: Once | INTRAMUSCULAR | Status: AC
  Administered 2021-12-11: 60 mg via INTRAMUSCULAR

## 2021-12-11 MED ORDER — KETOROLAC TROMETHAMINE 60 MG/2ML IM SOLN: 60.0000 mg | Freq: Once | INTRAMUSCULAR | Status: DC

## 2021-12-11 NOTE — Progress Notes (Signed)
[name] 51 y.o.   Chief Complaint  Patient presents with   Headache   Fever   Chills         HISTORY OF PRESENT ILLNESS: This is a 51 y.o. female complaining of headache for 4 days along with flulike symptoms. Tried over-the-counter analgesics as well as leftover oxycodone. No other associated symptoms. No other comp points or medical concerns today  Headache  Associated symptoms include a fever. Pertinent negatives include no abdominal pain, coughing, nausea or vomiting.  Fever  Associated symptoms include congestion and headaches. Pertinent negatives include no abdominal pain, chest pain, coughing, nausea, rash or vomiting.     Prior to Admission medications   Medication Sig Start Date End Date Taking? Authorizing Provider  cyclobenzaprine (FLEXERIL) 5 MG tablet Take 1 tablet (5 mg total) by mouth 3 (three) times daily as needed for muscle spasms. 05/22/21  Yes Burchette, Alinda Sierras, MD  diphenhydrAMINE (BENADRYL) 25 MG tablet Take 25 mg by mouth at bedtime as needed.   Yes [provider]  ferrous sulfate (SLOW IRON) 160 (50 Fe) MG TBCR SR tablet Take 1 tablet (160 mg total) by mouth daily. 05/22/21  Yes Burchette, Alinda Sierras, MD  HYDROcodone-acetaminophen (NORCO/VICODIN) 5-325 MG tablet Take 1 tablet by mouth every 4 (four) hours as needed for moderate pain.   Yes [provider]  ibuprofen (ADVIL,MOTRIN) 200 MG tablet Take 200 mg by mouth every 6 (six) hours as needed.   Yes [provider]  Vitamin D, Ergocalciferol, (DRISDOL) 1.25 MG (50000 UNIT) CAPS capsule Take 1 capsule (50,000 Units total) by mouth every 7 (seven) days. 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   AC (acromioclavicular) arthritis 08/26/2019   History of COVID-19 08/20/2019   COVID-19 05/31/2019   Vitamin D deficiency 10/15/2017   Iron deficiency anemia 05/10/2012   Lightheaded 04/21/2012   Right shoulder pain 04/21/2012   Nausea 04/21/2012   HYPOKALEMIA 10/17/2009   LOW BLOOD PRESSURE 10/17/2009   Fatigue 05/04/2009   NIGHT SWEATS 05/04/2009   POLYDIPSIA 05/04/2009   SINUSITIS, ACUTE 03/07/2009   ECZEMA 11/24/2008   SHINGLES, HX OF 11/24/2008   Disorder of bursae and tendons in shoulder region 11/09/2008    Past Medical History:  Diagnosis Date   Anemia    chronic anemia as of 07/25/21 per pt, s/p iron infusion in 2021   Chronic right shoulder pain 2020   Patient has rec'd steroid injections.   COVID-19 05/2019   HA, sore throat, cough, fatigue, SOB / Pt stated that she was in the ICU for 3 days and had lung damage. Still experiencing SOB w/ exertion as of 07/25/21 per pt.   Dyspnea 2023   Since having COVID in 2021, pt states that she gets SOB with any exertion. She states that she is unable to climb a single flight of stairs w/o experiencing SOB. Patient is not on any inhalers or breathing treatment. She denies any history of home oxygen use.  ECZEMA 11/24/2008   Enchondroma of humerus, right 2021   FATIGUE 05/04/2009   Headache(784.0)    migraines   HYPOKALEMIA 10/17/2009   Hypotension    normally runs about 90/60 per pt on 07/25/21   Inguinal adenopathy 12/30/2017   tender lymph node, lymph node biopsied around 2012 = normal   LOW BLOOD PRESSURE 10/17/2009   NIGHT SWEATS 05/04/2009   Polydipsia 05/04/2009   PONV (postoperative nausea and vomiting)    Respiratory arrest ([place]) 03/2020   Per pt, she had a sudden onset of syncope and respiratory arrest. Patient was on a  ventilator less that a day. Patient was in [place], [place] at the time of the respiratory arrest. There are no records in Epic or Care Everywhere.   Rotator cuff tendonitis, right 2018   SHINGLES, HX OF 11/24/2008   SINUSITIS, ACUTE 03/07/2009   UNSPEC DISORDERS BURSAE&TENDONS SHOULDER REGION 11/09/2008    Past Surgical History:  Procedure Laterality Date   BREAST BIOPSY Left 08/2019   titanium clip   BREAST CYST ASPIRATION Right 2021   CERVICAL CONIZATION W/BX N/A 08/01/2021   Procedure: CONIZATION CERVIX WITH BIOPSY AND D&C;  Surgeon: Newton Pigg, MD;  Location: [facility];  Service: Gynecology;  Laterality: N/A;   COLONOSCOPY WITH ESOPHAGOGASTRODUODENOSCOPY (EGD)     Dr. Collene Mares, Irritable Bowel syndrome, a few years ago per pt on 07/25/21   EXTERNAL AUDITORY CANAL RECONSTRUCTION     tubes as a child   TUBAL LIGATION  2007    Social History   Socioeconomic History   Marital status: Divorced    Spouse name: Not on file   Number of children: Not on file   Years of education: Not on file   Highest education level: Not on file  Occupational History   Not on file  Tobacco Use   Smoking status: Never   Smokeless tobacco: Never  Vaping Use   Vaping Use: Never used  Substance and Sexual Activity   Alcohol use: Yes    Alcohol/week: 1.0 standard drink of alcohol    Types: 1 Glasses of wine per week    Comment: once every week or two   Drug use: Never   Sexual activity: Not on file    Comment: tubal ligation  Other Topics Concern   Not on file  Social History Narrative   Works for [employer]       household of 2 but mostly lives by herself   Social Determinants of Radio broadcast assistant Strain: Not on file  Food Insecurity: Not on file  Transportation Needs: Not on file  Physical Activity: Not on file  Stress: Not on file  Social Connections: Not on file  Intimate Partner Violence: Not on file    Family History  Problem Relation Age of Onset    Arthritis Other    Hyperlipidemia Other    Hypertension Other    Cancer Other        lung   Diabetes Other    Breast cancer Neg Hx      Review of Systems  Constitutional:  Positive for fever.  HENT:  Positive for congestion.   Respiratory: Negative.  Negative for cough.   Cardiovascular:  Positive for palpitations. Negative for chest pain.  Gastrointestinal:  Negative for abdominal pain, nausea and vomiting.  Skin: Negative.  Negative for rash.  Neurological:  Positive for headaches.   Today's Vitals   12/11/21 1525  BP: 102/64  Pulse: 81  Temp:  98 F (36.7 C)  TempSrc: Oral  SpO2: 97%  Weight: 99 lb 8 oz (45.1 kg)  Height: '5\' 7"'$  (1.702 m)   Body mass index is 15.58 kg/m.   Physical Exam Vitals reviewed.  Constitutional:      Appearance: She is well-developed.  HENT:     Head: Normocephalic.  Eyes:     Extraocular Movements: Extraocular movements intact.     Conjunctiva/sclera: Conjunctivae normal.     Pupils: Pupils are equal, round, and reactive to light.  Cardiovascular:     Rate and Rhythm: Normal rate and regular rhythm.     Pulses: Normal pulses.     Heart sounds: Normal heart sounds.  Pulmonary:     Effort: Pulmonary effort is normal.     Breath sounds: Normal breath sounds.  Musculoskeletal:     Cervical back: No tenderness.  Lymphadenopathy:     Cervical: No cervical adenopathy.  Skin:    General: Skin is warm and dry.     Capillary Refill: Capillary refill takes less than 2 seconds.  Neurological:     General: No focal deficit present.     Mental Status: She is alert and oriented to person, place, and time.  Psychiatric:        Mood and Affect: Mood normal.        Behavior: Behavior normal.      ASSESSMENT & PLAN: Problem List Items Addressed This Visit       Other   Acute intractable tension-type headache - Primary    Clinically stable.  No red flag signs or symptoms. Migraine like headache. Unknown trigger.  No new medications or  over-the-counter supplements. No withdrawal symptoms Drinks caffeinated beverages on a regular basis No head injuries No visual problems.  Normal neurological examination. Afebrile.  Has some flulike symptoms. Probably viral illness.  COVID-negative. Continue over-the-counter analgesics and leftover oxycodone as needed. Advised to follow-up with PCP this week.      Other Visit Diagnoses     Fever, unspecified fever cause       Relevant Orders   POC COVID-19 (Completed)   Chills       Relevant Orders   POC COVID-19 (Completed)   Cough, unspecified type       Relevant Orders   POC COVID-19 (Completed)      Patient Instructions  General Headache Without Cause A headache is pain or discomfort you feel around the head or neck area. There are many causes and types of headaches. In some cases, the cause may not be found. Follow these instructions at home: Watch your condition for any changes. Let your doctor know about them. Take these steps to help with your condition: Managing pain     Take over-the-counter and prescription medicines only as told by your doctor. This includes medicines for pain that are taken by mouth or put on the skin. Lie down in a dark, quiet room when you have a headache. If told, put ice on your head and neck area: Put ice in a plastic bag. Place a towel between your skin and the bag. Leave the ice on for 20 minutes, 2-3 times per day. Take off the ice if your skin turns bright red. This is very important. If you cannot feel pain, heat, or cold, you have a greater risk of damage to the area. If told, put heat on the affected area. Use the heat source that your doctor recommends, such as a moist heat pack or a  heating pad. Place a towel between your skin and the heat source. Leave the heat on for 20-30 minutes. Take off the heat if your skin turns bright red. This is very important. If you cannot feel pain, heat, or cold, you have a greater risk of  getting burned. Keep lights dim if bright lights bother you or make your headaches worse. Eating and drinking Eat meals on a regular schedule. If you drink alcohol: Limit how much you have to: 0-1 drink a day for women who are not pregnant. 0-2 drinks a day for men. Know how much alcohol is in a drink. In the [place] one drink equals one 12 oz bottle of beer (355 mL), one 5 oz glass of wine (148 mL), or one 1 oz glass of hard liquor (44 mL). Stop drinking caffeine, or drink less caffeine. General instructions  Keep a journal to find out if certain things bring on headaches. For example, write down: What you eat and drink. How much sleep you get. Any change to your diet or medicines. Get a massage or try other ways to relax. Limit stress. Sit up straight. Do not tighten (tense) your muscles. Do not smoke or use any products that contain nicotine or tobacco. If you need help quitting, ask your doctor. Exercise regularly as told by your doctor. Get enough sleep. This often means 7-9 hours of sleep each night. Keep all follow-up visits. This is important. Contact a doctor if: Medicine does not help your symptoms. You have a headache that feels different than the other headaches. You feel like you may vomit (nauseous) or you vomit. You have a fever. Get help right away if: Your headache: Gets very bad quickly. Gets worse after a lot of physical activity. You have any of these symptoms: You continue to vomit. A stiff neck. Trouble seeing. Your eye or ear hurts. Trouble speaking. Weak muscles or you lose muscle control. You lose your balance or have trouble walking. You feel like you will pass out (faint) or you pass out. You are mixed up (confused). You have a seizure. These symptoms may be an emergency. Get help right away. Call your local emergency services (911 in the [place] Do not wait to see if the symptoms will go away. Do not drive yourself to the hospital. Summary A  headache is pain or discomfort that is felt around the head or neck area. There are many causes and types of headaches. In some cases, the cause may not be found. Keep a journal to help find out what causes your headaches. Watch your condition for any changes. Let your doctor know about them. Contact a doctor if you have a headache that is different from usual, or if medicine does not help your headache. Get help right away if your headache gets very bad, you throw up, you have trouble seeing, you lose your balance, or you have a seizure. This information is not intended to replace advice given to you by your health care provider. Make sure you discuss any questions you have with your health care provider. Document Revised: 08/29/2020 Document Reviewed: 08/29/2020 Elsevier Patient Education  [place], MD [facility]

## 2021-12-11 NOTE — Patient Instructions (Signed)
General Headache Without Cause A headache is pain or discomfort you feel around the head or neck area. There are many causes and types of headaches. In some cases, the cause may not be found. Follow these instructions at home: Watch your condition for any changes. Let your doctor know about them. Take these steps to help with your condition: Managing pain     Take over-the-counter and prescription medicines only as told by your doctor. This includes medicines for pain that are taken by mouth or put on the skin. Lie down in a dark, quiet room when you have a headache. If told, put ice on your head and neck area: Put ice in a plastic bag. Place a towel between your skin and the bag. Leave the ice on for 20 minutes, 2-3 times per day. Take off the ice if your skin turns bright red. This is very important. If you cannot feel pain, heat, or cold, you have a greater risk of damage to the area. If told, put heat on the affected area. Use the heat source that your doctor recommends, such as a moist heat pack or a heating pad. Place a towel between your skin and the heat source. Leave the heat on for 20-30 minutes. Take off the heat if your skin turns bright red. This is very important. If you cannot feel pain, heat, or cold, you have a greater risk of getting burned. Keep lights dim if bright lights bother you or make your headaches worse. Eating and drinking Eat meals on a regular schedule. If you drink alcohol: Limit how much you have to: 0-1 drink a day for women who are not pregnant. 0-2 drinks a day for men. Know how much alcohol is in a drink. In the [place] one drink equals one 12 oz bottle of beer (355 mL), one 5 oz glass of wine (148 mL), or one 1 oz glass of hard liquor (44 mL). Stop drinking caffeine, or drink less caffeine. General instructions  Keep a journal to find out if certain things bring on headaches. For example, write down: What you eat and drink. How much sleep you  get. Any change to your diet or medicines. Get a massage or try other ways to relax. Limit stress. Sit up straight. Do not tighten (tense) your muscles. Do not smoke or use any products that contain nicotine or tobacco. If you need help quitting, ask your doctor. Exercise regularly as told by your doctor. Get enough sleep. This often means 7-9 hours of sleep each night. Keep all follow-up visits. This is important. Contact a doctor if: Medicine does not help your symptoms. You have a headache that feels different than the other headaches. You feel like you may vomit (nauseous) or you vomit. You have a fever. Get help right away if: Your headache: Gets very bad quickly. Gets worse after a lot of physical activity. You have any of these symptoms: You continue to vomit. A stiff neck. Trouble seeing. Your eye or ear hurts. Trouble speaking. Weak muscles or you lose muscle control. You lose your balance or have trouble walking. You feel like you will pass out (faint) or you pass out. You are mixed up (confused). You have a seizure. These symptoms may be an emergency. Get help right away. Call your local emergency services (911 in the [place] Do not wait to see if the symptoms will go away. Do not drive yourself to the hospital. Summary A headache is pain or discomfort that   is felt around the head or neck area. There are many causes and types of headaches. In some cases, the cause may not be found. Keep a journal to help find out what causes your headaches. Watch your condition for any changes. Let your doctor know about them. Contact a doctor if you have a headache that is different from usual, or if medicine does not help your headache. Get help right away if your headache gets very bad, you throw up, you have trouble seeing, you lose your balance, or you have a seizure. This information is not intended to replace advice given to you by your health care provider. Make sure you  discuss any questions you have with your health care provider. Document Revised: 08/29/2020 Document Reviewed: 08/29/2020 Elsevier Patient Education  2023 Elsevier Inc[employer]  

## 2021-12-11 NOTE — Assessment & Plan Note (Signed)
Clinically stable.  No red flag signs or symptoms. Migraine like headache. Unknown trigger.  No new medications or over-the-counter supplements. No withdrawal symptoms Drinks caffeinated beverages on a regular basis No head injuries No visual problems.  Normal neurological examination. Afebrile.  Has some flulike symptoms. Probably viral illness.  COVID-negative. Continue over-the-counter analgesics and leftover oxycodone as needed. Advised to follow-up with PCP this week.

## 2022-01-29 ENCOUNTER — Other Ambulatory Visit: Payer: Self-pay | Admitting: Obstetrics and Gynecology

## 2022-01-29 DIAGNOSIS — Z1231 Encounter for screening mammogram for malignant neoplasm of breast: Secondary | ICD-10-CM

## 2022-06-03 ENCOUNTER — Ambulatory Visit
Admission: RE | Admit: 2022-06-03 | Discharge: 2022-06-03 | Disposition: A | Discharge status: Home / Self Care | Payer: 59 | Source: Ambulatory Visit | Attending: Obstetrics and Gynecology | Admitting: Obstetrics and Gynecology

## 2022-06-03 DIAGNOSIS — Z1231 Encounter for screening mammogram for malignant neoplasm of breast: Secondary | ICD-10-CM

## 2022-06-17 ENCOUNTER — Ambulatory Visit: Payer: Managed Care, Other (non HMO)

## 2022-08-29 ENCOUNTER — Other Ambulatory Visit: Payer: Self-pay | Admitting: Obstetrics and Gynecology

## 2022-08-29 DIAGNOSIS — R92343 Mammographic extreme density, bilateral breasts: Secondary | ICD-10-CM

## 2022-09-05 ENCOUNTER — Other Ambulatory Visit: Payer: 59

## 2022-11-18 ENCOUNTER — Other Ambulatory Visit: Payer: Self-pay | Admitting: Obstetrics and Gynecology

## 2022-11-18 DIAGNOSIS — R92343 Mammographic extreme density, bilateral breasts: Secondary | ICD-10-CM

## 2022-12-02 ENCOUNTER — Other Ambulatory Visit: Payer: 59

## 2023-01-21 ENCOUNTER — Ambulatory Visit: Payer: 59 | Admitting: Family Medicine

## 2023-01-21 VITALS — BP 86/56 | HR 69 | Temp 98.2°F | Ht 67.0 in | Wt 111.5 lb

## 2023-01-21 DIAGNOSIS — D509 Iron deficiency anemia, unspecified: Secondary | ICD-10-CM

## 2023-01-21 DIAGNOSIS — H538 Other visual disturbances: Secondary | ICD-10-CM | POA: Diagnosis not present

## 2023-01-21 DIAGNOSIS — R5383 Other fatigue: Secondary | ICD-10-CM

## 2023-01-21 LAB — CBC WITH DIFFERENTIAL/PLATELET
Basophils Absolute: 0.1 10*3/uL (ref 0.0–0.1)
Basophils Relative: 0.8 % (ref 0.0–3.0)
Eosinophils Absolute: 0.2 10*3/uL (ref 0.0–0.7)
Eosinophils Relative: 3.3 % (ref 0.0–5.0)
HCT: 35 % — ABNORMAL LOW (ref 36.0–46.0)
Hemoglobin: 11.1 g/dL — ABNORMAL LOW (ref 12.0–15.0)
Lymphocytes Relative: 43.8 % (ref 12.0–46.0)
Lymphs Abs: 2.8 10*3/uL (ref 0.7–4.0)
MCHC: 31.9 g/dL (ref 30.0–36.0)
MCV: 92.4 fL (ref 78.0–100.0)
Monocytes Absolute: 0.5 10*3/uL (ref 0.1–1.0)
Monocytes Relative: 7.5 % (ref 3.0–12.0)
Neutro Abs: 2.8 10*3/uL (ref 1.4–7.7)
Neutrophils Relative %: 44.6 % (ref 43.0–77.0)
Platelets: 259 10*3/uL (ref 150.0–400.0)
RBC: 3.78 Mil/uL — ABNORMAL LOW (ref 3.87–5.11)
RDW: 16.7 % — ABNORMAL HIGH (ref 11.5–15.5)
WBC: 6.3 10*3/uL (ref 4.0–10.5)

## 2023-01-21 LAB — COMPREHENSIVE METABOLIC PANEL
ALT: 14 U/L (ref 0–35)
AST: 18 U/L (ref 0–37)
Albumin: 4.2 g/dL (ref 3.5–5.2)
Alkaline Phosphatase: 76 U/L (ref 39–117)
BUN: 13 mg/dL (ref 6–23)
CO2: 31 meq/L (ref 19–32)
Calcium: 9.1 mg/dL (ref 8.4–10.5)
Chloride: 102 meq/L (ref 96–112)
Creatinine, Ser: 0.77 mg/dL (ref 0.40–1.20)
GFR: 89.46 mL/min (ref >60.00)
Glucose, Bld: 97 mg/dL (ref 70–99)
Potassium: 3.5 meq/L (ref 3.5–5.1)
Sodium: 140 meq/L (ref 135–145)
Total Bilirubin: 0.3 mg/dL (ref 0.2–1.2)
Total Protein: 6.9 g/dL (ref 6.0–8.3)

## 2023-01-21 LAB — TSH: TSH: 1.61 u[IU]/mL (ref 0.35–5.50)

## 2023-01-21 MED ORDER — CYCLOBENZAPRINE HCL 5 MG PO TABS
5.0000 mg | ORAL_TABLET | Freq: Three times a day (TID) | ORAL | 1 refills | Status: AC | PRN
Start: 2023-01-21 — End: ?

## 2023-01-21 NOTE — Progress Notes (Unsigned)
Established Patient Office Visit  Subjective   Patient ID: [name], female    DOB: Aug 30, 1970  Age: 52 y.o. MRN: [id number]  Chief Complaint  Patient presents with   Headache    Pt reports headache after the blurred eye vision yesterday. Denied headache today.    Blurred Vision    Pt reports blurred vision on L eye on Monday night.    Dizziness    Sx started on Monday night. Took dramamine. Sx subsided. "Still feels off"    HPI  {History (Optional):23778} [name] has history of iron deficiency anemia, vitamin D deficiency, chronic intermittent fatigue.  She is seen today with reports of transient blurred vision left eye this past Monday night.  This lasted for about an hour.  She denied any slurred speech or any focal weakness.  Denied any headache until the next morning.  No history of ocular migraine.  No right eye symptoms.  She felt like she also had some diplopia left eye during that episode of blurred vision.  Non-smoker.  No history of diabetes.  No history of hypertension.  She states she had a sister who had stroke at age 23.  Etiology unclear.  She has had some intermittent lightheadedness since then.  No consistent orthostatic symptoms.  Her normal blood pressure is usually around 90/50.  Tries to stay well-hydrated.  Does has history of chronic anemia and has responded in the past to iron infusion.  Has had full endoscopic workup previously which was unrevealing.  No obvious blood loss sources recently.  Had conization procedure of her cervix last year and is followed closely by GYN  Past Medical History:  Diagnosis Date   Anemia    chronic anemia as of 07/25/21 per pt, s/p iron infusion in 2021   Chronic right shoulder pain 2020   Patient has rec'd steroid injections.   COVID-19 05/2019   HA, sore throat, cough, fatigue, SOB / Pt stated that she was in the ICU for 3 days and had lung damage. Still experiencing SOB w/ exertion as of 07/25/21 per pt.   Dyspnea 2023    Since having COVID in 2021, pt states that she gets SOB with any exertion. She states that she is unable to climb a single flight of stairs w/o experiencing SOB. Patient is not on any inhalers or breathing treatment. She denies any history of home oxygen use.   ECZEMA 11/24/2008   Enchondroma of humerus, right 2021   FATIGUE 05/04/2009   Headache(784.0)    migraines   HYPOKALEMIA 10/17/2009   Hypotension    normally runs about 90/60 per pt on 07/25/21   Inguinal adenopathy 12/30/2017   tender lymph node, lymph node biopsied around 2012 = normal   LOW BLOOD PRESSURE 10/17/2009   NIGHT SWEATS 05/04/2009   Polydipsia 05/04/2009   PONV (postoperative nausea and vomiting)    Respiratory arrest (HCC) 03/2020   Per pt, she had a sudden onset of syncope and respiratory arrest. Patient was on a ventilator less that a day. Patient was in [place], [place] at the time of the respiratory arrest. There are no records in Epic or Care Everywhere.   Rotator cuff tendonitis, right 2018   SHINGLES, HX OF 11/24/2008   SINUSITIS, ACUTE 03/07/2009   UNSPEC DISORDERS BURSAE&TENDONS SHOULDER REGION 11/09/2008   Past Surgical History:  Procedure Laterality Date   BREAST BIOPSY Left 08/2019   titanium clip   BREAST CYST ASPIRATION Right 2021   CERVICAL CONIZATION W/BX  N/A 08/01/2021   Procedure: CONIZATION CERVIX WITH BIOPSY AND D&C;  Surgeon: Tracey Harries, MD;  Location: [facility];  Service: Gynecology;  Laterality: N/A;   COLONOSCOPY WITH ESOPHAGOGASTRODUODENOSCOPY (EGD)     Dr. Loreta Ave, Irritable Bowel syndrome, a few years ago per pt on 07/25/21   EXTERNAL AUDITORY CANAL RECONSTRUCTION     tubes as a child   TUBAL LIGATION  2007    reports that she has never smoked. She has never used smokeless tobacco. She reports current alcohol use of about 1.0 standard drink of alcohol per week. She reports that she does not use drugs. family history includes Arthritis in an other family member; Cancer  in an other family member; Diabetes in an other family member; Hyperlipidemia in an other family member; Hypertension in an other family member. Allergies  Allergen Reactions   Lactose Intolerance (Gi) Diarrhea and Nausea And Vomiting   Megace [Megestrol] Diarrhea and Nausea And Vomiting    Review of Systems  Constitutional:  Positive for malaise/fatigue. Negative for chills, fever and weight loss.  Eyes:  Negative for blurred vision.       See HPI   Respiratory:  Negative for shortness of breath.   Cardiovascular:  Negative for chest pain.  Gastrointestinal:  Negative for abdominal pain.  Genitourinary:  Negative for dysuria.  Neurological:  Positive for dizziness. Negative for speech change, focal weakness, seizures, loss of consciousness, weakness and headaches.      Objective:     BP (!) 86/56 (BP Location: Right Arm, Patient Position: Sitting, Cuff Size: Normal)   Pulse 69   Temp 98.2 F (36.8 C) (Oral)   Ht 5\' 7"  (1.702 m)   Wt 111 lb 8 oz (50.6 kg)   SpO2 98%   BMI 17.46 kg/m  BP Readings from Last 3 Encounters:  01/21/23 (!) 86/56  12/11/21 102/64  08/01/21 (!) 101/52   Wt Readings from Last 3 Encounters:  01/21/23 111 lb 8 oz (50.6 kg)  12/11/21 99 lb 8 oz (45.1 kg)  08/01/21 106 lb 8 oz (48.3 kg)      Physical Exam Vitals reviewed.  Constitutional:      Appearance: She is well-developed.  Eyes:     Extraocular Movements: Extraocular movements intact.     Pupils: Pupils are equal, round, and reactive to light.     Comments: Fundi appear normal.  No hemorrhages or visible exudates.  Neck:     Thyroid: No thyromegaly.     Vascular: No JVD.  Cardiovascular:     Rate and Rhythm: Normal rate and regular rhythm.     Heart sounds:     No gallop.  Pulmonary:     Effort: Pulmonary effort is normal. No respiratory distress.     Breath sounds: Normal breath sounds. No wheezing or rales.  Musculoskeletal:     Cervical back: Neck supple.  Neurological:      Mental Status: She is alert and oriented to person, place, and time.     Cranial Nerves: No cranial nerve deficit.     Motor: No weakness.     Coordination: Coordination normal.  Psychiatric:        Mood and Affect: Mood normal.        Speech: Speech normal.      No results found for any visits on 01/21/23.  Last CBC Lab Results  Component Value Date   WBC 5.4 07/30/2021   HGB 8.9 (L) 07/30/2021   HCT 29.0 (L)  07/30/2021   MCV 92.7 07/30/2021   MCH 28.4 07/30/2021   RDW 14.7 07/30/2021   PLT 276 07/30/2021   Last metabolic panel Lab Results  Component Value Date   GLUCOSE 97 07/30/2021   NA 141 07/30/2021   K 3.5 07/30/2021   CL 109 07/30/2021   CO2 26 07/30/2021   BUN 16 07/30/2021   CREATININE 0.79 07/30/2021   GFRNONAA >60 07/30/2021   CALCIUM 8.4 (L) 07/30/2021   PROT 6.7 07/30/2021   ALBUMIN 3.6 07/30/2021   BILITOT 0.5 07/30/2021   ALKPHOS 70 07/30/2021   AST 19 07/30/2021   ALT 15 07/30/2021   ANIONGAP 6 07/30/2021   Last thyroid functions Lab Results  Component Value Date   TSH 0.99 09/12/2020      The ASCVD Risk score (Arnett DK, et al., 2019) failed to calculate for the following reasons:   The valid systolic blood pressure range is 90 to 200 mmHg   Cannot find a previous HDL lab   Cannot find a previous total cholesterol lab    Assessment & Plan:   #1 recent transient blurred vision left eye.  No history of known ocular migraines.  No episodes since occurrence last Monday.  Did not have any associated speech change or focal weakness.  No eye symptoms at this time whatsoever.  -Consider carotid Dopplers and MRI to further assess -Follow-up immediately for any recurrent symptoms -Recommend follow-up good baseline ophthalmology exam  #2 recent progressive fatigue and lightheadedness.  Past history of iron deficiency anemia.  She responded previously to infusions but had difficulty with oral iron.  Recheck CBC along with iron studies.  Consider  infusion if still low  #3 lightheadedness.  Patient has low blood pressure but this is apparently near her baseline.  Did not have any significant orthostatic drop.  Her blood pressure went from 96/66 supine to 98/64 sitting and 92/66 standing No follow-ups on file.    Evelena Peat, MD

## 2023-01-21 NOTE — Patient Instructions (Signed)
I will be setting up Carotid doppler and MRI of brain.   Set up physical soon.

## 2023-01-22 LAB — IRON,TIBC AND FERRITIN PANEL
%SAT: 14 % — ABNORMAL LOW (ref 16–45)
Ferritin: 4 ng/mL — ABNORMAL LOW (ref 16–232)
Iron: 56 ug/dL (ref 45–160)
TIBC: 403 ug/dL (ref 250–450)

## 2023-01-27 ENCOUNTER — Telehealth: Payer: Self-pay

## 2023-01-27 NOTE — Telephone Encounter (Signed)
I spoke with [name] with [place] [employer] in regards to Pre-authorization for Iron infusion. Melanee Spry informed me no authorization is required at this time for procedure and 20% coinsurance is to be paid after meeting deductible.

## 2023-01-28 ENCOUNTER — Telehealth: Payer: Self-pay

## 2023-01-28 ENCOUNTER — Ambulatory Visit (HOSPITAL_COMMUNITY): Payer: 59

## 2023-01-28 NOTE — Telephone Encounter (Signed)
Patient has been scheduled for Iron infusion on 01-30-23 at 10 Am at [facility]

## 2023-01-29 ENCOUNTER — Other Ambulatory Visit (HOSPITAL_COMMUNITY): Payer: Self-pay

## 2023-01-29 NOTE — Telephone Encounter (Signed)
Pt says Monday is not a good day for infusion, asks that it's scheduled Wednesday or thursday

## 2023-01-29 NOTE — Telephone Encounter (Signed)
I spoke with the patient and she reports she would like Wednesday, Thursday or Friday of next week. I attempted to contact Infusion center but received no answer at this time but will attempt to call back.

## 2023-01-29 NOTE — Telephone Encounter (Signed)
Left a message for the patient to return my call.  °

## 2023-01-29 NOTE — Telephone Encounter (Signed)
Pt called in asking to speak with care team regarding infusion.

## 2023-01-30 ENCOUNTER — Encounter (HOSPITAL_COMMUNITY): Payer: Self-pay

## 2023-01-30 ENCOUNTER — Encounter (HOSPITAL_COMMUNITY): Payer: 59

## 2023-01-30 NOTE — Telephone Encounter (Signed)
Left a message for scheduler with [place] [facility] to return my call to have patient scheduled.

## 2023-01-30 NOTE — Telephone Encounter (Signed)
I spoke with scheduler and patient has been scheduled for infusion on 02/06/2023 at 9 am at [facility]. Message was left for patient to return my call.

## 2023-02-02 NOTE — Telephone Encounter (Signed)
I spoke with the patient and infusion has been scheduled for 02/04/2023 at 10 am per patient request. Patient informed and provided number to Infusion clinic incase reschedule is needed.

## 2023-02-03 ENCOUNTER — Ambulatory Visit (HOSPITAL_COMMUNITY)
Admission: RE | Admit: 2023-02-03 | Discharge: 2023-02-03 | Disposition: A | Discharge status: Home / Self Care | Payer: 59 | Source: Ambulatory Visit | Attending: Family Medicine | Admitting: Family Medicine

## 2023-02-03 DIAGNOSIS — H538 Other visual disturbances: Secondary | ICD-10-CM | POA: Insufficient documentation

## 2023-02-04 ENCOUNTER — Ambulatory Visit (HOSPITAL_COMMUNITY)
Admission: RE | Admit: 2023-02-04 | Discharge: 2023-02-04 | Disposition: A | Discharge status: Home / Self Care | Payer: 59 | Source: Ambulatory Visit | Attending: Family Medicine | Admitting: Family Medicine

## 2023-02-04 DIAGNOSIS — I959 Hypotension, unspecified: Secondary | ICD-10-CM | POA: Diagnosis not present

## 2023-02-04 DIAGNOSIS — D649 Anemia, unspecified: Secondary | ICD-10-CM | POA: Insufficient documentation

## 2023-02-04 MED ORDER — IRON SUCROSE 400 MG IVPB - SIMPLE MED
400.0000 mg | Status: DC
  Administered 2023-02-04: 400 mg via INTRAVENOUS
  Filled 2023-02-04: qty 400

## 2023-02-04 MED ORDER — SODIUM CHLORIDE 0.9 % IV SOLN: 400.0000 mg | INTRAVENOUS | Status: DC

## 2023-02-06 ENCOUNTER — Ambulatory Visit (HOSPITAL_BASED_OUTPATIENT_CLINIC_OR_DEPARTMENT_OTHER)
Admission: RE | Admit: 2023-02-06 | Discharge: 2023-02-06 | Disposition: A | Discharge status: Home / Self Care | Payer: 59 | Source: Ambulatory Visit | Attending: Family Medicine | Admitting: Family Medicine

## 2023-02-06 ENCOUNTER — Encounter (HOSPITAL_COMMUNITY): Payer: 59

## 2023-02-06 DIAGNOSIS — H538 Other visual disturbances: Secondary | ICD-10-CM | POA: Diagnosis not present

## 2023-02-11 ENCOUNTER — Ambulatory Visit (HOSPITAL_COMMUNITY)
Admission: RE | Admit: 2023-02-11 | Discharge: 2023-02-11 | Disposition: A | Discharge status: Home / Self Care | Payer: 59 | Source: Ambulatory Visit | Attending: Family Medicine | Admitting: Family Medicine

## 2023-02-11 DIAGNOSIS — D509 Iron deficiency anemia, unspecified: Secondary | ICD-10-CM | POA: Diagnosis present

## 2023-02-11 MED ORDER — IRON SUCROSE 400 MG IVPB - SIMPLE MED
400.0000 mg | Status: DC
  Administered 2023-02-11: 400 mg via INTRAVENOUS
  Filled 2023-02-11: qty 400

## 2023-02-24 ENCOUNTER — Telehealth: Payer: Self-pay | Admitting: Family Medicine

## 2023-02-24 NOTE — Telephone Encounter (Signed)
Checking on progress of medical questionnaire forms faxed last month and on 02/18/23.

## 2023-03-02 NOTE — Telephone Encounter (Signed)
Roxie  parameds is on a time dead line for the forms to be return

## 2023-03-02 NOTE — Telephone Encounter (Signed)
Forms faxed

## 2023-03-02 NOTE — Telephone Encounter (Signed)
Forms placed in red folder for sign

## 2023-05-28 ENCOUNTER — Other Ambulatory Visit: Payer: Self-pay | Admitting: Obstetrics and Gynecology

## 2023-05-28 DIAGNOSIS — Z0000 Encounter for general adult medical examination without abnormal findings: Secondary | ICD-10-CM

## 2023-06-05 ENCOUNTER — Ambulatory Visit: Payer: 59

## 2023-06-18 ENCOUNTER — Ambulatory Visit
Admission: RE | Admit: 2023-06-18 | Discharge: 2023-06-18 | Disposition: A | Discharge status: Home / Self Care | Payer: 59 | Source: Ambulatory Visit | Attending: Obstetrics and Gynecology | Admitting: Obstetrics and Gynecology

## 2023-06-18 DIAGNOSIS — Z0000 Encounter for general adult medical examination without abnormal findings: Secondary | ICD-10-CM

## 2023-06-26 ENCOUNTER — Other Ambulatory Visit: Payer: Self-pay | Admitting: Obstetrics and Gynecology

## 2023-06-26 DIAGNOSIS — R92343 Mammographic extreme density, bilateral breasts: Secondary | ICD-10-CM

## 2023-11-24 ENCOUNTER — Ambulatory Visit: Payer: Self-pay

## 2023-11-24 NOTE — Telephone Encounter (Signed)
 FYI Only or Action Required?: FYI only for provider.  Patient was last seen in primary care on 01/21/2023 by Micheal Wolm ORN, MD.  Called Nurse Triage reporting Rash.  Symptoms began yesterday.  Interventions attempted: Nothing.  Symptoms are: gradually worsening.  Triage Disposition: See Physician Within 24 Hours  Patient/caregiver understands and will follow disposition?: Yes Copied from CRM #[id number]. Topic: Clinical - Red Word Triage >> Nov 24, 2023  3:11 PM [place] A wrote: [facility] that prompted transfer to Nurse Triage: Patient has rash on the back of her neck that is burning and painful Reason for Disposition  [1] Shingles rash (matches SYMPTOMS) AND [2] onset < 72 hours ago (3 days)  Answer Assessment - Initial Assessment Questions 1. APPEARANCE of RASH: What does the rash look like?      Tiny red bumps  2. LOCATION: Where is the rash located?      Across the Back of neck, into hairline  3. ONSET: When did the rash start?      Started right after neck  4. ITCHING: Does the rash itch? If Yes, ask: How bad is the itch?  (Scale 1-10; or mild, moderate, severe)    No itching  5. PAIN: Does the rash hurt? If Yes, ask: How bad is the pain?  (Scale 0-10; or none, mild, moderate, severe)     Yes, burning pain  6. OTHER SYMPTOMS: Do you have any other symptoms? (e.g., fever)     Fatigue  7. PREGNANCY: Is there any chance you are pregnant? When was your last menstrual period?     LMP- perimenopausal, Last August  Protocols used: Shingles (Zoster)-A-AH

## 2023-11-25 ENCOUNTER — Inpatient Hospital Stay: Admitting: Adult Health

## 2023-11-25 ENCOUNTER — Encounter: Payer: Self-pay | Admitting: Adult Health

## 2023-11-25 ENCOUNTER — Ambulatory Visit (INDEPENDENT_AMBULATORY_CARE_PROVIDER_SITE_OTHER): Admitting: Adult Health

## 2023-11-25 VITALS — BP 100/62 | HR 67 | Temp 98.2°F | Ht 67.0 in | Wt 108.0 lb

## 2023-11-25 DIAGNOSIS — L309 Dermatitis, unspecified: Secondary | ICD-10-CM

## 2023-11-25 MED ORDER — PREDNISONE 20 MG PO TABS
20.0000 mg | ORAL_TABLET | Freq: Every day | ORAL | 0 refills | Status: AC
Start: 2023-11-25 — End: ?

## 2023-11-25 MED ORDER — TRIAMCINOLONE ACETONIDE 0.5 % EX OINT: 1.0000 | TOPICAL_OINTMENT | Freq: Two times a day (BID) | CUTANEOUS | 0 refills | Status: AC

## 2023-11-25 NOTE — Progress Notes (Signed)
 Subjective:    Patient ID: [name], female    DOB: [birth year], 53 y.o.   MRN: [id number]  Rash    53 year old female who  has a past medical history of Anemia, Chronic right shoulder pain (2020), COVID-19 (05/2019), Dyspnea (2023), ECZEMA (11/24/2008), Enchondroma of humerus, right (2021), FATIGUE (05/04/2009), Headache(784.0), HYPOKALEMIA (10/17/2009), Hypotension, Inguinal adenopathy (12/30/2017), LOW BLOOD PRESSURE (10/17/2009), NIGHT SWEATS (05/04/2009), Polydipsia (05/04/2009), PONV (postoperative nausea and vomiting), Respiratory arrest (HCC) (03/2020), Rotator cuff tendonitis, right (2018), SHINGLES, HX OF (11/24/2008), SINUSITIS, ACUTE (03/07/2009), and UNSPEC DISORDERS BURSAE&TENDONS SHOULDER REGION (11/09/2008).  She is a patient of Dr. Micheal who I am seeing today for an acute issue.   She reports yesterday she noted a rash on the right side of her neck and then developed a rash on  left ring finger this morning. Rash has a burning sensation   She denies changes in soaps, detergents, or lotions. She has not been working in the yard. No new jewelry   At home she has been using calamine lotion, benadryl and Advil without improvement.     Review of Systems  Skin:  Positive for rash.   See HPI   Past Medical History:  Diagnosis Date   Anemia    chronic anemia as of 07/25/21 per pt, s/p iron  infusion in 2021   Chronic right shoulder pain 2020   Patient has rec'd steroid injections.   COVID-19 05/2019   HA, sore throat, cough, fatigue, SOB / Pt stated that she was in the ICU for 3 days and had lung damage. Still experiencing SOB w/ exertion as of 07/25/21 per pt.   Dyspnea 2023   Since having COVID in 2021, pt states that she gets SOB with any exertion. She states that she is unable to climb a single flight of stairs w/o experiencing SOB. Patient is not on any inhalers or breathing treatment. She denies any history of home oxygen use.   ECZEMA 11/24/2008    Enchondroma of humerus, right 2021   FATIGUE 05/04/2009   Headache(784.0)    migraines   HYPOKALEMIA 10/17/2009   Hypotension    normally runs about 90/60 per pt on 07/25/21   Inguinal adenopathy 12/30/2017   tender lymph node, lymph node biopsied around 2012 = normal   LOW BLOOD PRESSURE 10/17/2009   NIGHT SWEATS 05/04/2009   Polydipsia 05/04/2009   PONV (postoperative nausea and vomiting)    Respiratory arrest (HCC) 03/2020   Per pt, she had a sudden onset of syncope and respiratory arrest. Patient was on a ventilator less that a day. Patient was in [place], [place] at the time of the respiratory arrest. There are no records in Epic or Care Everywhere.   Rotator cuff tendonitis, right 2018   SHINGLES, HX OF 11/24/2008   SINUSITIS, ACUTE 03/07/2009   UNSPEC DISORDERS BURSAE&TENDONS SHOULDER REGION 11/09/2008    Social History   Socioeconomic History   Marital status: Divorced    Spouse name: Not on file   Number of children: Not on file   Years of education: Not on file   Highest education level: Not on file  Occupational History   Not on file  Tobacco Use   Smoking status: Never   Smokeless tobacco: Never  Vaping Use   Vaping status: Never Used  Substance and Sexual Activity   Alcohol use: Yes    Alcohol/week: 1.0 standard drink of alcohol    Types: 1 Glasses of wine per week  Comment: once every week or two   Drug use: Never   Sexual activity: Not on file    Comment: tubal ligation  Other Topics Concern   Not on file  Social History Narrative   Works for [employer]       household of 2 but mostly lives by herself   Social Drivers of Corporate investment banker Strain: Not on file  Food Insecurity: Not on file  Transportation Needs: Not on file  Physical Activity: Not on file  Stress: Not on file  Social Connections: Not on file  Intimate Partner Violence: Not on file    Past Surgical History:  Procedure Laterality Date   BREAST BIOPSY Left 08/2019    titanium clip   BREAST CYST ASPIRATION Right 2021   CERVICAL CONIZATION W/BX N/A 08/01/2021   Procedure: CONIZATION CERVIX WITH BIOPSY AND D&C;  Surgeon: Austin Ned, MD;  Location: [facility];  Service: Gynecology;  Laterality: N/A;   COLONOSCOPY WITH ESOPHAGOGASTRODUODENOSCOPY (EGD)     Dr. Kristie, Irritable Bowel syndrome, a few years ago per pt on 07/25/21   EXTERNAL AUDITORY CANAL RECONSTRUCTION     tubes as a child   TUBAL LIGATION  2007    Family History  Problem Relation Age of Onset   Arthritis Other    Hyperlipidemia Other    Hypertension Other    Cancer Other        lung   Diabetes Other    Breast cancer Neg Hx     Allergies  Allergen Reactions   Lactose Intolerance (Gi) Diarrhea and Nausea And Vomiting   Megace [Megestrol] Diarrhea and Nausea And Vomiting    Current Outpatient Medications on File Prior to Visit  Medication Sig Dispense Refill   cyclobenzaprine  (FLEXERIL ) 5 MG tablet Take 1 tablet (5 mg total) by mouth 3 (three) times daily as needed for muscle spasms. 30 tablet 1   diphenhydrAMINE (BENADRYL) 25 MG tablet Take 25 mg by mouth at bedtime as needed.     ferrous sulfate  (SLOW IRON ) 160 (50 Fe) MG TBCR SR tablet Take 1 tablet (160 mg total) by mouth daily. (Patient taking differently: Take 160 mg by mouth daily. Taking once or twice a week.) 90 tablet 1   ibuprofen (ADVIL,MOTRIN) 200 MG tablet Take 200 mg by mouth every 6 (six) hours as needed.     Vitamin D , Ergocalciferol , (DRISDOL ) 1.25 MG (50000 UNIT) CAPS capsule Take 1 capsule (50,000 Units total) by mouth every 7 (seven) days. 12 capsule 1   No current facility-administered medications on file prior to visit.    BP 100/62   Pulse 67   Temp 98.2 F (36.8 C) (Oral)   Ht 5' 7 (1.702 m)   Wt 108 lb (49 kg)   SpO2 97%   BMI 16.92 kg/m       Objective:   Physical Exam Vitals and nursing note reviewed.  Constitutional:      Appearance: Normal appearance.  Neck:   Skin:     General: Skin is warm and dry.     Capillary Refill: Capillary refill takes less than 2 seconds.     Findings: Erythema and rash present.      Neurological:     General: No focal deficit present.     Mental Status: She is alert and oriented to person, place, and time.  Psychiatric:        Mood and Affect: Mood normal.  Behavior: Behavior normal.        Thought Content: Thought content normal.        Judgment: Judgment normal.       Assessment & Plan:  1. Dermatitis (Primary) - Contact dermatitis? Does not appear as psoriasis or eczema at this point. No vesicles or pustule noted. Will treat with oral steroids and cortisone cream. Follow up in office if not improving in the next 4-5 days  - predniSONE  (DELTASONE ) 20 MG tablet; Take 1 tablet (20 mg total) by mouth daily with breakfast.  Dispense: 7 tablet; Refill: 0 - triamcinolone  ointment (KENALOG ) 0.5 %; Apply 1 Application topically 2 (two) times daily.  Dispense: 30 g; Refill: 0  Darleene Shape, NP

## 2023-12-21 ENCOUNTER — Encounter: Payer: Self-pay | Admitting: Family Medicine

## 2023-12-21 ENCOUNTER — Ambulatory Visit (INDEPENDENT_AMBULATORY_CARE_PROVIDER_SITE_OTHER): Admitting: Family Medicine

## 2023-12-21 VITALS — BP 86/58 | HR 67 | Temp 97.8°F | Wt 108.6 lb

## 2023-12-21 DIAGNOSIS — E611 Iron deficiency: Secondary | ICD-10-CM

## 2023-12-21 DIAGNOSIS — K140 Glossitis: Secondary | ICD-10-CM | POA: Diagnosis not present

## 2023-12-21 DIAGNOSIS — R21 Rash and other nonspecific skin eruption: Secondary | ICD-10-CM

## 2023-12-21 DIAGNOSIS — R5383 Other fatigue: Secondary | ICD-10-CM | POA: Diagnosis not present

## 2023-12-21 MED ORDER — DEXAMETHASONE 0.5 MG/5ML PO ELIX: ORAL_SOLUTION | ORAL | 1 refills | Status: AC

## 2023-12-21 NOTE — Patient Instructions (Signed)
 Let me know if tongue ulcer not fully healed in 2-3 weeks

## 2023-12-21 NOTE — Progress Notes (Signed)
 Established Patient Office Visit  Subjective   Patient ID: [name], female    DOB: [birth year]  Age: 53 y.o. MRN: [id number]  Chief Complaint  Patient presents with   Rash    HPI   [name] is seen today for several items  She had recent rash which was near the scalp/hairline right cervical neck.  She was seen here on the 13th and treated for possible dermatitis type rash with steroids.  She was given topical and low-dose oral prednisone .  Rash has cleared significantly since then.  She has some residual scaliness in her scalp.  No history of seborrheic dermatitis.  She was concerned regarding etiology of rash.  We reassured her looking at pictures this did not look like any kind of shingles.  She also has ulcer left tongue which have been present for a week or so.  No other intraoral mouth sores.  She is dealing with tremendous stress at work with very high workload.  She works for [employer].  No hx of oral tobacco use.   She has had some recent fatigue issues that she knows some of this may be stress related.  Her weight is overall stable.  She has had history of iron  deficiency which has been worked up fully by GI in the past.  Last year had iron  infusion.  History of poor absorption.  She is requesting follow-up labs.  She also has history of vitamin D  deficiency and requesting repeat levels.  Denies any abdominal pain.  No recent bloody stools or melena.  Past Medical History:  Diagnosis Date   Anemia    chronic anemia as of 07/25/21 per pt, s/p iron  infusion in 2021   Chronic right shoulder pain 2020   Patient has rec'd steroid injections.   COVID-19 05/2019   HA, sore throat, cough, fatigue, SOB / Pt stated that she was in the ICU for 3 days and had lung damage. Still experiencing SOB w/ exertion as of 07/25/21 per pt.   Dyspnea 2023   Since having COVID in 2021, pt states that she gets SOB with any exertion. She states that she is unable to climb a single flight of stairs w/o  experiencing SOB. Patient is not on any inhalers or breathing treatment. She denies any history of home oxygen use.   ECZEMA 11/24/2008   Enchondroma of humerus, right 2021   FATIGUE 05/04/2009   Headache(784.0)    migraines   HYPOKALEMIA 10/17/2009   Hypotension    normally runs about 90/60 per pt on 07/25/21   Inguinal adenopathy 12/30/2017   tender lymph node, lymph node biopsied around 2012 = normal   LOW BLOOD PRESSURE 10/17/2009   NIGHT SWEATS 05/04/2009   Polydipsia 05/04/2009   PONV (postoperative nausea and vomiting)    Respiratory arrest (HCC) 03/2020   Per pt, she had a sudden onset of syncope and respiratory arrest. Patient was on a ventilator less that a day. Patient was in [place], [place] at the time of the respiratory arrest. There are no records in Epic or Care Everywhere.   Rotator cuff tendonitis, right 2018   SHINGLES, HX OF 11/24/2008   SINUSITIS, ACUTE 03/07/2009   UNSPEC DISORDERS BURSAE&TENDONS SHOULDER REGION 11/09/2008   Past Surgical History:  Procedure Laterality Date   BREAST BIOPSY Left 08/2019   titanium clip   BREAST CYST ASPIRATION Right 2021   CERVICAL CONIZATION W/BX N/A 08/01/2021   Procedure: CONIZATION CERVIX WITH BIOPSY AND D&C;  Surgeon: Austin Ned,  MD;  Location: [facility];  Service: Gynecology;  Laterality: N/A;   COLONOSCOPY WITH ESOPHAGOGASTRODUODENOSCOPY (EGD)     Dr. Kristie, Irritable Bowel syndrome, a few years ago per pt on 07/25/21   EXTERNAL AUDITORY CANAL RECONSTRUCTION     tubes as a child   TUBAL LIGATION  2007    reports that she has never smoked. She has never used smokeless tobacco. She reports current alcohol use of about 1.0 standard drink of alcohol per week. She reports that she does not use drugs. family history includes Arthritis in an other family member; Cancer in an other family member; Diabetes in an other family member; Hyperlipidemia in an other family member; Hypertension in an other family  member. Allergies  Allergen Reactions   Lactose Intolerance (Gi) Diarrhea and Nausea And Vomiting   Megace [Megestrol] Diarrhea and Nausea And Vomiting    Review of Systems  Constitutional:  Positive for malaise/fatigue. Negative for chills, fever and weight loss.  HENT:  Negative for sore throat.   Eyes:  Negative for blurred vision.  Respiratory:  Negative for shortness of breath.   Cardiovascular:  Negative for chest pain.  Gastrointestinal:  Negative for abdominal pain and blood in stool.  Neurological:  Negative for dizziness and headaches.      Objective:     BP (!) 86/58   Pulse 67   Temp 97.8 F (36.6 C) (Oral)   Wt 108 lb 9.6 oz (49.3 kg)   SpO2 95%   BMI 17.01 kg/m  BP Readings from Last 3 Encounters:  12/21/23 (!) 86/58  11/25/23 100/62  02/11/23 108/60   Wt Readings from Last 3 Encounters:  12/21/23 108 lb 9.6 oz (49.3 kg)  11/25/23 108 lb (49 kg)  02/11/23 111 lb (50.3 kg)      Physical Exam Vitals reviewed.  Constitutional:      General: She is not in acute distress. HENT:     Mouth/Throat:     Comments: Small approximately 2 mm superficial aphthous type ulcer left tongue but no other intraoral lesions noted. Cardiovascular:     Rate and Rhythm: Normal rate and regular rhythm.  Pulmonary:     Effort: Pulmonary effort is normal.     Breath sounds: Normal breath sounds. No wheezing or rales.  Musculoskeletal:     Cervical back: Neck supple.  Lymphadenopathy:     Cervical: No cervical adenopathy.  Skin:    Comments: Right posterior neck examined and reveals some nonspecific mild scaliness near the hairline but no other rash visible.      No results found for any visits on 12/21/23.  Last CBC Lab Results  Component Value Date   WBC 6.3 01/21/2023   HGB 11.1 (L) 01/21/2023   HCT 35.0 (L) 01/21/2023   MCV 92.4 01/21/2023   MCH 28.4 07/30/2021   RDW 16.7 (H) 01/21/2023   PLT 259.0 01/21/2023   Last metabolic panel Lab Results   Component Value Date   GLUCOSE 97 01/21/2023   NA 140 01/21/2023   K 3.5 01/21/2023   CL 102 01/21/2023   CO2 31 01/21/2023   BUN 13 01/21/2023   CREATININE 0.77 01/21/2023   GFR 89.46 01/21/2023   CALCIUM 9.1 01/21/2023   PROT 6.9 01/21/2023   ALBUMIN 4.2 01/21/2023   BILITOT 0.3 01/21/2023   ALKPHOS 76 01/21/2023   AST 18 01/21/2023   ALT 14 01/21/2023   ANIONGAP 6 07/30/2021   Last hemoglobin A1c Lab Results  Component Value  Date   HGBA1C 5.6 01/03/2011   Last thyroid  functions Lab Results  Component Value Date   TSH 1.61 01/21/2023      The ASCVD Risk score (Arnett DK, et al., 2019) failed to calculate for the following reasons:   The valid systolic blood pressure range is 90 to 200 mmHg   Cannot find a previous HDL lab   Cannot find a previous total cholesterol lab    Assessment & Plan:   #1 recent right posterior neck rash.  Has cleared with topical and oral steroids.  Possible contact type dermatitis.  Reassured this is clearing  #2 fatigue.  She has had pressed history of recurrent iron  deficiency and we will recheck CBC along with chemistries and iron  level.  Also recheck TSH  #3 small aphthous type ulcer left tongue.  We wrote for dexamethasone  elixir to use 5 mL swish for 5 minutes and then spit 4 times daily.  Be in touch if ulcer not fully healed within 2 to 3 weeks   No follow-ups on file.    Wolm Scarlet, MD

## 2023-12-22 ENCOUNTER — Ambulatory Visit: Payer: Self-pay | Admitting: Family Medicine

## 2023-12-22 LAB — CBC WITH DIFFERENTIAL/PLATELET
Basophils Absolute: 0.1 K/uL (ref 0.0–0.1)
Basophils Relative: 1.2 % (ref 0.0–3.0)
Eosinophils Absolute: 0.1 K/uL (ref 0.0–0.7)
Eosinophils Relative: 2.4 % (ref 0.0–5.0)
HCT: 35.3 % — ABNORMAL LOW (ref 36.0–46.0)
Hemoglobin: 11.9 g/dL — ABNORMAL LOW (ref 12.0–15.0)
Lymphocytes Relative: 40.9 % (ref 12.0–46.0)
Lymphs Abs: 2.1 K/uL (ref 0.7–4.0)
MCHC: 33.7 g/dL (ref 30.0–36.0)
MCV: 98.8 fl (ref 78.0–100.0)
Monocytes Absolute: 0.4 K/uL (ref 0.1–1.0)
Monocytes Relative: 7.5 % (ref 3.0–12.0)
Neutro Abs: 2.4 K/uL (ref 1.4–7.7)
Neutrophils Relative %: 48 % (ref 43.0–77.0)
Platelets: 235 K/uL (ref 150.0–400.0)
RBC: 3.57 Mil/uL — ABNORMAL LOW (ref 3.87–5.11)
RDW: 12.9 % (ref 11.5–15.5)
WBC: 5.1 K/uL (ref 4.0–10.5)

## 2023-12-22 LAB — COMPREHENSIVE METABOLIC PANEL WITH GFR
ALT: 16 U/L (ref 0–35)
AST: 20 U/L (ref 0–37)
Albumin: 3.9 g/dL (ref 3.5–5.2)
Alkaline Phosphatase: 85 U/L (ref 39–117)
BUN: 16 mg/dL (ref 6–23)
CO2: 30 meq/L (ref 19–32)
Calcium: 8.9 mg/dL (ref 8.4–10.5)
Chloride: 103 meq/L (ref 96–112)
Creatinine, Ser: 0.75 mg/dL (ref 0.40–1.20)
GFR: 91.74 mL/min (ref >60.00)
Glucose, Bld: 93 mg/dL (ref 70–99)
Potassium: 3.6 meq/L (ref 3.5–5.1)
Sodium: 141 meq/L (ref 135–145)
Total Bilirubin: 0.4 mg/dL (ref 0.2–1.2)
Total Protein: 6.7 g/dL (ref 6.0–8.3)

## 2023-12-22 LAB — IRON,TIBC AND FERRITIN PANEL
%SAT: 32 % (ref 16–45)
Ferritin: 52 ng/mL (ref 16–232)
Iron: 88 ug/dL (ref 45–160)
TIBC: 275 ug/dL (ref 250–450)

## 2023-12-22 LAB — TSH: TSH: 1.22 u[IU]/mL (ref 0.35–5.50)

## 2023-12-22 LAB — VITAMIN D 25 HYDROXY (VIT D DEFICIENCY, FRACTURES): VITD: 26.77 ng/mL — ABNORMAL LOW (ref 30.00–100.00)

## 2023-12-23 ENCOUNTER — Telehealth: Payer: Self-pay | Admitting: *Deleted

## 2023-12-23 DIAGNOSIS — E559 Vitamin D deficiency, unspecified: Secondary | ICD-10-CM

## 2023-12-23 NOTE — Telephone Encounter (Signed)
 Copied from CRM [id number]. Topic: Clinical - Lab/Test Results >> Dec 23, 2023  8:55 AM Gustabo D wrote: I gave pt the lab results message left by her pcp-Vitamin D  2000- pt takes it everyday and she says it's not working. Pt wants to know if she can get iron  infusion it makes her feel better more energy. Vitamin D , Ergocalciferol , (DRISDOL ) 1.25 MG (50000 UNIT) CAPS capsule- Pt says these work for her and she wants to know if she can get these. Green pills B12- says she's been doing B12 strip and it's tearing her stomach up.

## 2023-12-24 ENCOUNTER — Telehealth: Payer: Self-pay | Admitting: *Deleted

## 2023-12-24 MED ORDER — VITAMIN D (ERGOCALCIFEROL) 1.25 MG (50000 UNIT) PO CAPS: 50000.0000 [IU] | ORAL_CAPSULE | ORAL | 3 refills | Status: AC

## 2023-12-24 NOTE — Telephone Encounter (Signed)
 Left message for the patient to return my call.

## 2023-12-24 NOTE — Telephone Encounter (Signed)
 Copied from CRM [id number]. Topic: General - Other >> Dec 24, 2023  8:51 AM [place] wrote: Reason for CRM: Patient returning call from Good, Mykal L, CMA.

## 2023-12-24 NOTE — Telephone Encounter (Signed)
Patient informed of the message and rx sent 

## 2023-12-24 NOTE — Telephone Encounter (Signed)
 Please see previous encounter
